# Patient Record
Sex: Female | Born: 1970 | Race: White | Hispanic: No | Smoking: Never smoker
Health system: Southern US, Community
[De-identification: ages and names within clinical notes are randomized; demographics above are authoritative.]

## PROBLEM LIST (undated history)

## (undated) DIAGNOSIS — N920 Excessive and frequent menstruation with regular cycle: Secondary | ICD-10-CM

## (undated) DIAGNOSIS — Z5189 Encounter for other specified aftercare: Secondary | ICD-10-CM

## (undated) DIAGNOSIS — J45909 Unspecified asthma, uncomplicated: Secondary | ICD-10-CM

## (undated) DIAGNOSIS — Z9889 Other specified postprocedural states: Secondary | ICD-10-CM

## (undated) DIAGNOSIS — K219 Gastro-esophageal reflux disease without esophagitis: Secondary | ICD-10-CM

## (undated) DIAGNOSIS — R112 Nausea with vomiting, unspecified: Secondary | ICD-10-CM

## (undated) DIAGNOSIS — D219 Benign neoplasm of connective and other soft tissue, unspecified: Secondary | ICD-10-CM

## (undated) DIAGNOSIS — F419 Anxiety disorder, unspecified: Secondary | ICD-10-CM

## (undated) DIAGNOSIS — F32A Depression, unspecified: Secondary | ICD-10-CM

## (undated) DIAGNOSIS — J189 Pneumonia, unspecified organism: Secondary | ICD-10-CM

## (undated) DIAGNOSIS — D649 Anemia, unspecified: Secondary | ICD-10-CM

## (undated) HISTORY — PX: LAPAROSCOPIC GELPORT ASSISTED MYOMECTOMY: SHX6549

## (undated) HISTORY — PX: VULVECTOMY: SHX1086

## (undated) HISTORY — PX: WISDOM TOOTH EXTRACTION: SHX21

## (undated) HISTORY — PX: TONSILLECTOMY: SUR1361

## (undated) HISTORY — PX: ENDOMETRIAL ABLATION: SHX621

## (undated) HISTORY — PX: APPENDECTOMY: SHX54

## (undated) HISTORY — PX: ABDOMINAL HYSTERECTOMY: SHX81

## (undated) HISTORY — PX: TUBAL LIGATION: SHX77

## (undated) HISTORY — PX: GYNECOLOGIC CRYOSURGERY: SHX857

## (undated) HISTORY — PX: DIAGNOSTIC LAPAROSCOPY: SUR761

## (undated) HISTORY — PX: DILATION AND CURETTAGE OF UTERUS: SHX78

---

## 2012-08-06 ENCOUNTER — Emergency Department: Payer: Self-pay | Admitting: Emergency Medicine

## 2018-07-02 ENCOUNTER — Other Ambulatory Visit: Payer: Self-pay | Admitting: Medical Oncology

## 2018-07-02 DIAGNOSIS — Z1231 Encounter for screening mammogram for malignant neoplasm of breast: Secondary | ICD-10-CM

## 2018-07-04 ENCOUNTER — Ambulatory Visit
Admission: RE | Admit: 2018-07-04 | Discharge: 2018-07-04 | Disposition: A | Discharge status: Home / Self Care | Payer: No Typology Code available for payment source | Source: Ambulatory Visit | Attending: Medical Oncology | Admitting: Medical Oncology

## 2018-07-04 ENCOUNTER — Inpatient Hospital Stay: Admission: RE | Admit: 2018-07-04 | Payer: Self-pay | Source: Ambulatory Visit

## 2018-07-04 ENCOUNTER — Encounter (INDEPENDENT_AMBULATORY_CARE_PROVIDER_SITE_OTHER): Payer: Self-pay

## 2018-07-04 DIAGNOSIS — Z1231 Encounter for screening mammogram for malignant neoplasm of breast: Secondary | ICD-10-CM | POA: Insufficient documentation

## 2019-08-09 ENCOUNTER — Other Ambulatory Visit: Payer: Self-pay | Admitting: Family Medicine

## 2019-08-09 DIAGNOSIS — Z1231 Encounter for screening mammogram for malignant neoplasm of breast: Secondary | ICD-10-CM

## 2019-08-14 ENCOUNTER — Ambulatory Visit
Admission: RE | Admit: 2019-08-14 | Discharge: 2019-08-14 | Disposition: A | Discharge status: Home / Self Care | Payer: No Typology Code available for payment source | Source: Ambulatory Visit | Attending: Family Medicine | Admitting: Family Medicine

## 2019-08-14 ENCOUNTER — Other Ambulatory Visit: Payer: Self-pay

## 2019-08-14 DIAGNOSIS — Z1231 Encounter for screening mammogram for malignant neoplasm of breast: Secondary | ICD-10-CM | POA: Diagnosis present

## 2020-07-02 ENCOUNTER — Other Ambulatory Visit: Payer: Self-pay | Admitting: Obstetrics & Gynecology

## 2020-07-13 NOTE — H&P (Signed)
HPI: Erica Fuller is an established patient with a family history of ovarian and breast cancer. Mother was diagnosed with ovarian cancer at 41 and survived 5.5 years. Stage 3, likely serous. Maternal grandmother had breast cancer in her 62s.   Patient is BRCA 1/2 negative. Has long history of fibroid uterus, menometrorrhagia,  s/p BTL, D&C, laparoscopic myomectomy, cesarean x2, failed endometrial ablation. She is interested in surgical interventions to eliminate her periods, pelvic pressure, and reduce risk of ovarian cancer, as well as the anxiety she harbors daily regarding her cancer risk.  She has been on combined OCPs for >5years, which reduces, but does not eliminate her risk. She has been counseled extensively since our first encounter in March about surgical menopause and effects on cardiovascular, bone, cognitive function, mental health and longevity.  She has understood these risks with clarity and continues to request surgical removal of her ovaries.  She has no contraindications to estrogen therapy following oophorectomy.   She is a Marine scientist; does home care.   Workup:  Pap: 04/2017 neg/neg, cryosurgery 1995, no abnormal paps since.  EMBx: 06/18/20 NO ENDOMETRIAL TISSUE IDENTIFIED.  MINUTE FRAGMENTS OF BENIGN ENDOCERVICAL GLANDS,  MUCUS, AND BLOOD. *No suspicion for malignancy.  TVUS: 11/2019 Uterus: anteverted, enlarged, 16 x 8 x 14cm  With several large intramural (type 3-5) fibroids: 1)Lt lateral=4.5cm 2)posterior=5.9cm 3)Lt lateral mid=4.2cm 4)Lt fundal=4.9cm 5)posterior fundal=4.7cm 6)mid=3.6cm 7)anterior=3.5cm 8)Lt posterior=2.5cm LO: 3.5 x 1 x 1cm RO: 3 x 2 x 2cm  10/2019 CA 125: elevated - 39.6 (normal < 38.1)   Past Medical History:  has a past medical history of Abnormal cytology (01/28/1993), Allergic state, Anxiety, Depression, Encounter for blood transfusion (2007), Family history of ovarian cancer (07/19/2012), Fibroid, Menorrhagia, PONV  (postoperative nausea and vomiting), and Poor intravenous access.  Past Surgical History:  has a past surgical history that includes Appendectomy (1990); Dilation and curettage of uterus (2006); Laparoscopic Myomectomy (04/2004); Tubal ligation (2008); Tonsillectomy (2000); Gynecologic cryosurgery (1995); Cesarean section (2004, 2008); Pelvic laparoscopy (05/2004); Endometrial ablation (10/2009); Tonsillectomy (1997); and vulvectomy (Bilateral, 10/16/2015). Family History: family history includes Alcohol abuse in her paternal grandfather; Breast cancer (age of onset: 34) in her maternal grandmother; Depression in her mother; Diabetes in her maternal grandmother; Glaucoma in her father; Heart disease in her maternal grandmother; Hyperlipidemia (Elevated cholesterol) in her maternal grandmother; Lymphoma in her maternal grandmother; Ovarian cancer (age of onset: 9) in her mother; Prostate cancer in her paternal grandfather. Social History:  reports that she has never smoked. She has never used smokeless tobacco. She reports that she does not drink alcohol and does not use drugs. OB/GYN History:          OB History    Gravida  4   Para  4   Term  2   Preterm  2   AB      Living  3     SAB      IAB      Ectopic      Molar      Multiple  1   Live Births  6          Allergies: is allergic to latex, codeine, fentanyl, morphine, and sulfa (sulfonamide antibiotics). Medications:  Current Outpatient Medications:  .  cyclobenzaprine (FLEXERIL) 10 MG tablet, Take 1 tablet (10 mg total) by mouth nightly as needed, Disp: 30 tablet, Rfl: 11 .  escitalopram oxalate (LEXAPRO) 20 MG tablet, Take 1.5 tablets (30 mg total) by mouth once daily, Disp: 45 tablet,  Rfl: 11 .  gabapentin (NEURONTIN) 100 MG capsule, TAKE 2-3 CAPSULES BY MOUTH NIGHTLY PRN, Disp: 180 capsule, Rfl: 3 .  norethindrone-ethinyl estradiol (JUNEL FE 1/20, 28,) 1 mg-20 mcg (21)/75 mg (7) tablet, Take 1 tablet by mouth  once daily, Disp: 90 tablet, Rfl: 3 .  omeprazole (PRILOSEC) 20 MG DR capsule, Take 20 mg by mouth once daily, Disp: , Rfl:    Review of Systems: No SOB, no palpitations or chest pain, no new lower extremity edema, no nausea or vomiting or bowel or bladder complaints. See HPI for gyn specific ROS.   Exam:   Constitutional: BP 124/68   Ht 177.8 cm (5' 10" )   Wt (!) 103.6 kg (228 lb 6.4 oz)   LMP  (LMP Unknown)   BMI 32.77 kg/m   WDWN female in NAD   HEENT: sclera clear, non-icteric, moist mucous membranes, dentition intact Endocrine:  no thyromegaly Respiratory: normal respiratory effort, CTABL    CV: no peripheral edema, RRR no MRG GI: soft , no mass, non-tender, no rebound tenderness  Genitalia:     Pelvic exam:         External: Tanner stage 5, normal female genitalia for age without lesions or masses, no inguinal lymphadenopathy, normal vulva and perineal sensation, anal wink and bulbocavernosus reflexes present.        Bladder: Normal size without masses or tenderness, well-supported        Urethra: No lesions or discharge with palpation. Normal urethral size and location, no prolapse, no meatal caruncle, no diverticulum        Vagina: fibroid uterus descended within vagina and palpable upon entry. normal physiological discharge, without lesions or masses, no abnormal support structures        Cervix: no lesions or masses, no CMT, no discharge        Adnexa: non-tender, without masses or fullness bilaterally        Uterus: mobile, large but doesn't fill pelvis, well below the umbilicus. right posteriolateral dominant mass, non-tender. 14wk size. Adnexa and Parametria accessible bilaterally.        Anus/Perineum: Normal external exam, no engorged hemorrhoids  Skin: warm and well perfused, no rashes Neuro: alert, oriented x3,   Psych: appropriate mood and insight, judgement intact    Impression:   The primary encounter diagnosis was Preoperative exam for  gynecologic surgery. Diagnoses of Pre-op examination, Menorrhalgia, Intramural leiomyoma of uterus, Pre-procedural examination, and Pelvic pressure in female were also pertinent to this visit.    Plan:     1. Her enlarged uterus is mobile and able to shift within the pelvis, and I was able to feel the adnexa and parametria bilaterally.  I am cautious but confident that the laparoscopic approach can be attempted.  Patient understands that if this is found to be unsafe during surgery, that an open incision will be performed.     2. We have now twice discussed in detail the alternatives to hysterectomy, as well as risks and benefits of her desired surgery.  She continues to request hysterectomy + BSO.  Planned procedure: Total laparoscopic hysterectomy, with bilateral salpingo-oophorectomy with hand-morcellation. Will decide intraoperatively if TLH vs LSH is feasible.  She has hx of cryosurgery >65yr ago with routine paps and no abnormal paps since.  The patient and I discussed the technical aspects of the procedure including various routes, and the potential of each for risks and complications.These include but are not limited to the risk of infection requiring post-operative antibiotics or  further procedures.We talked about the risk of injury to adjacent organs including bladder, bowel, ureter, blood vessels or nerves, the need to convert to an open incision, possibleneed for blood transfusion andpostop complications such asthromboembolic or cardiopulmonary complications.All of her questions were answered. Her preoperative exam was completed. She is scheduled to undergo this procedure in the near future.  I personally performed the service. (TP)  Terena Bohan CRIST Isla Sabree, MD

## 2020-07-17 ENCOUNTER — Encounter
Admission: RE | Admit: 2020-07-17 | Discharge: 2020-07-17 | Disposition: A | Discharge status: Home / Self Care | Payer: No Typology Code available for payment source | Source: Ambulatory Visit | Attending: Obstetrics & Gynecology | Admitting: Obstetrics & Gynecology

## 2020-07-17 DIAGNOSIS — Z01812 Encounter for preprocedural laboratory examination: Secondary | ICD-10-CM | POA: Insufficient documentation

## 2020-07-17 HISTORY — DX: Encounter for other specified aftercare: Z51.89

## 2020-07-17 HISTORY — DX: Unspecified asthma, uncomplicated: J45.909

## 2020-07-17 HISTORY — DX: Anxiety disorder, unspecified: F41.9

## 2020-07-17 HISTORY — DX: Benign neoplasm of connective and other soft tissue, unspecified: D21.9

## 2020-07-17 HISTORY — DX: Other specified postprocedural states: Z98.890

## 2020-07-17 HISTORY — DX: Anemia, unspecified: D64.9

## 2020-07-17 HISTORY — DX: Nausea with vomiting, unspecified: R11.2

## 2020-07-17 HISTORY — DX: Excessive and frequent menstruation with regular cycle: N92.0

## 2020-07-17 HISTORY — DX: Depression, unspecified: F32.A

## 2020-07-17 HISTORY — DX: Pneumonia, unspecified organism: J18.9

## 2020-07-17 HISTORY — DX: Gastro-esophageal reflux disease without esophagitis: K21.9

## 2020-07-17 NOTE — Patient Instructions (Addendum)
Your procedure is scheduled on: 07/27/20-  Monday Report to the Registration Desk on the 1st floor of the [facility]. To find out your arrival time, please call [phone] between 1PM - 3PM on: 07/24/20- Friday  REMEMBER: Instructions that are not followed completely may result in serious medical risk, up to and including death; or upon the discretion of your surgeon and anesthesiologist your surgery may need to be rescheduled.  Do not eat food after midnight the night before surgery.  No gum chewing, lozengers or hard candies.  You may however, drink CLEAR liquids up to 2 hours before you are scheduled to arrive for your surgery. Do not drink anything within 2 hours of your scheduled arrival time.  Clear liquids include: - water  - apple juice without pulp - gatorade (not RED, PURPLE, OR BLUE) - black coffee or tea (Do NOT add milk or creamers to the coffee or tea) Do NOT drink anything that is not on this list.  In addition, your doctor has ordered for you to drink the provided  Ensure Pre-Surgery Clear Carbohydrate Drink  Drinking this carbohydrate drink up to two hours before surgery helps to reduce insulin resistance and improve patient outcomes. Please complete drinking 2 hours prior to scheduled arrival time.  TAKE THESE MEDICATIONS THE MORNING OF SURGERY WITH A SIP OF WATER:  - omeprazole (PRILOSEC) 20 MG capsule, take one the night before and one on the morning of surgery - helps to prevent nausea after surgery.  One week prior to surgery: Stop Anti-inflammatories (NSAIDS) such as Advil, Aleve, Ibuprofen, Motrin, Naproxen, Naprosyn and Aspirin based products such as Excedrin, Goodys Powder, BC Powder.  Stop ANY OVER THE COUNTER supplements until after surgery. (However, you may continue taking Vitamin D, Vitamin B, and multivitamin up until the day before surgery.)  No Alcohol for 24 hours before or after surgery.  No Smoking including e-cigarettes for 24 hours prior  to surgery.  No chewable tobacco products for at least 6 hours prior to surgery.  No nicotine patches on the day of surgery.  Do not use any "recreational" drugs for at least a week prior to your surgery.  Please be advised that the combination of cocaine and anesthesia may have negative outcomes, up to and including death. If you test positive for cocaine, your surgery will be cancelled.  On the morning of surgery brush your teeth with toothpaste and water, you may rinse your mouth with mouthwash if you wish. Do not swallow any toothpaste or mouthwash.  Do not wear jewelry, make-up, hairpins, clips or nail polish.  Do not wear lotions, powders, or perfumes.   Do not shave body from the neck down 48 hours prior to surgery just in case you cut yourself which could leave a site for infection.  Also, freshly shaved skin may become irritated if using the CHG soap.  Contact lenses, hearing aids and dentures may not be worn into surgery.  Do not bring valuables to the hospital. [facility] is not responsible for any missing/lost belongings or valuables.   Use CHG Soap or wipes as directed on instruction sheet.  Notify your doctor if there is any change in your medical condition (cold, fever, infection).  Wear comfortable clothing (specific to your surgery type) to the hospital.  Plan for stool softeners for home use; pain medications have a tendency to cause constipation. You can also help prevent constipation by eating foods high in fiber such as fruits and vegetables and drinking  plenty of fluids as your diet allows.  After surgery, you can help prevent lung complications by doing breathing exercises.  Take deep breaths and cough every 1-2 hours. Your doctor may order a device called an Incentive Spirometer to help you take deep breaths. When coughing or sneezing, hold a pillow firmly against your incision with both hands. This is called "splinting." Doing this helps protect your  incision. It also decreases belly discomfort.  If you are being admitted to the hospital overnight, leave your suitcase in the car. After surgery it may be brought to your room.  If you are being discharged the day of surgery, you will not be allowed to drive home. You will need a responsible adult (18 years or older) to drive you home and stay with you that night.   If you are taking public transportation, you will need to have a responsible adult (18 years or older) with you. Please confirm with your physician that it is acceptable to use public transportation.   Please call the [place] Dept. at [phone] if you have any questions about these instructions.  Visitation Policy:  Patients undergoing a surgery or procedure may have one family member or support person with them as long as that person is not COVID-19 positive or experiencing its symptoms.  That person may remain in the waiting area during the procedure.  Inpatient Visitation Update:   In an effort to ensure the safety of our team members and our patients, we are implementing a change to our visitation policy:  Effective Monday, Aug. 9, at 7 a.m., inpatients will be allowed one support person.  o The support person may change daily.  o The support person must pass our screening, gel in and out, and wear a mask at all times, including in the patient's room.  o Patients must also wear a mask when staff or their support person are in the room.  o Masking is required regardless of vaccination status.  Systemwide, no visitors 17 or younger.

## 2020-07-24 ENCOUNTER — Other Ambulatory Visit
Admission: RE | Admit: 2020-07-24 | Discharge: 2020-07-24 | Disposition: A | Discharge status: Home / Self Care | Payer: No Typology Code available for payment source | Source: Ambulatory Visit | Attending: Obstetrics & Gynecology | Admitting: Obstetrics & Gynecology

## 2020-07-24 ENCOUNTER — Other Ambulatory Visit: Payer: Self-pay

## 2020-07-24 DIAGNOSIS — Z20822 Contact with and (suspected) exposure to covid-19: Secondary | ICD-10-CM | POA: Diagnosis not present

## 2020-07-24 DIAGNOSIS — Z01812 Encounter for preprocedural laboratory examination: Secondary | ICD-10-CM | POA: Diagnosis not present

## 2020-07-24 LAB — BASIC METABOLIC PANEL
Anion gap: 9 (ref 5–15)
BUN: 22 mg/dL — ABNORMAL HIGH (ref 6–20)
CO2: 24 mmol/L (ref 22–32)
Calcium: 9.1 mg/dL (ref 8.9–10.3)
Chloride: 104 mmol/L (ref 98–111)
Creatinine, Ser: 0.87 mg/dL (ref 0.44–1.00)
GFR, Estimated: >60 mL/min (ref >60)
Glucose, Bld: 117 mg/dL — ABNORMAL HIGH (ref 70–99)
Potassium: 3.7 mmol/L (ref 3.5–5.1)
Sodium: 137 mmol/L (ref 135–145)

## 2020-07-24 LAB — CBC
HCT: 43.5 % (ref 36.0–46.0)
Hemoglobin: 14.7 g/dL (ref 12.0–15.0)
MCH: 29.1 pg (ref 26.0–34.0)
MCHC: 33.8 g/dL (ref 30.0–36.0)
MCV: 86.1 fL (ref 80.0–100.0)
Platelets: 358 10*3/uL (ref 150–400)
RBC: 5.05 MIL/uL (ref 3.87–5.11)
RDW: 13.2 % (ref 11.5–15.5)
WBC: 9.7 10*3/uL (ref 4.0–10.5)
nRBC: 0 % (ref 0.0–0.2)

## 2020-07-24 LAB — TYPE AND SCREEN
ABO/RH(D): A POS
Antibody Screen: NEGATIVE

## 2020-07-24 LAB — SARS CORONAVIRUS 2 (TAT 6-24 HRS): SARS Coronavirus 2: NEGATIVE

## 2020-07-27 ENCOUNTER — Encounter: Payer: Self-pay | Admitting: Obstetrics & Gynecology

## 2020-07-27 ENCOUNTER — Encounter
Admission: RE | Disposition: A | Discharge status: Home / Self Care | Payer: Self-pay | Source: Home / Self Care | Attending: Obstetrics & Gynecology

## 2020-07-27 ENCOUNTER — Other Ambulatory Visit: Payer: Self-pay

## 2020-07-27 ENCOUNTER — Ambulatory Visit: Payer: No Typology Code available for payment source | Admitting: Anesthesiology

## 2020-07-27 ENCOUNTER — Ambulatory Visit
Admission: RE | Admit: 2020-07-27 | Discharge: 2020-07-27 | Disposition: A | Discharge status: Home / Self Care | Payer: No Typology Code available for payment source | Attending: Obstetrics & Gynecology | Admitting: Obstetrics & Gynecology

## 2020-07-27 DIAGNOSIS — Z882 Allergy status to sulfonamides status: Secondary | ICD-10-CM | POA: Diagnosis not present

## 2020-07-27 DIAGNOSIS — Z91040 Latex allergy status: Secondary | ICD-10-CM | POA: Insufficient documentation

## 2020-07-27 DIAGNOSIS — D251 Intramural leiomyoma of uterus: Secondary | ICD-10-CM | POA: Diagnosis not present

## 2020-07-27 DIAGNOSIS — Z885 Allergy status to narcotic agent status: Secondary | ICD-10-CM | POA: Diagnosis not present

## 2020-07-27 DIAGNOSIS — Z803 Family history of malignant neoplasm of breast: Secondary | ICD-10-CM | POA: Insufficient documentation

## 2020-07-27 DIAGNOSIS — Z8041 Family history of malignant neoplasm of ovary: Secondary | ICD-10-CM | POA: Diagnosis not present

## 2020-07-27 DIAGNOSIS — Z79899 Other long term (current) drug therapy: Secondary | ICD-10-CM | POA: Diagnosis not present

## 2020-07-27 HISTORY — PX: LAPAROSCOPIC BILATERAL SALPINGO OOPHERECTOMY: SHX5890

## 2020-07-27 HISTORY — PX: LAPAROSCOPIC SUPRACERVICAL HYSTERECTOMY: SHX5399

## 2020-07-27 LAB — POCT PREGNANCY, URINE: Preg Test, Ur: NEGATIVE

## 2020-07-27 LAB — ABO/RH: ABO/RH(D): A POS

## 2020-07-27 SURGERY — HYSTERECTOMY, SUPRACERVICAL, LAPAROSCOPIC
Anesthesia: General

## 2020-07-27 MED ORDER — CHLORHEXIDINE GLUCONATE 0.12 % MT SOLN: 15.0000 mL | Freq: Once | OROMUCOSAL | Status: AC

## 2020-07-27 MED ORDER — HEPARIN SODIUM (PORCINE) 5000 UNIT/ML IJ SOLN
INTRAMUSCULAR | Status: AC
  Administered 2020-07-27: 5000 [IU] via SUBCUTANEOUS
  Filled 2020-07-27: qty 1

## 2020-07-27 MED ORDER — GABAPENTIN 300 MG PO CAPS
ORAL_CAPSULE | ORAL | Status: AC
  Administered 2020-07-27: 600 mg via ORAL
  Filled 2020-07-27: qty 2

## 2020-07-27 MED ORDER — CEFAZOLIN SODIUM-DEXTROSE 2-4 GM/100ML-% IV SOLN
INTRAVENOUS | Status: AC
  Filled 2020-07-27: qty 100

## 2020-07-27 MED ORDER — DEXAMETHASONE SODIUM PHOSPHATE 10 MG/ML IJ SOLN: 4.0000 mg | INTRAMUSCULAR | Status: AC

## 2020-07-27 MED ORDER — ROCURONIUM BROMIDE 100 MG/10ML IV SOLN
INTRAVENOUS | Status: DC | PRN
  Administered 2020-07-27 (×2): 20 mg via INTRAVENOUS
  Administered 2020-07-27: 50 mg via INTRAVENOUS

## 2020-07-27 MED ORDER — MIDAZOLAM HCL 2 MG/2ML IJ SOLN
INTRAMUSCULAR | Status: AC
  Filled 2020-07-27: qty 2

## 2020-07-27 MED ORDER — HYDROMORPHONE HCL 1 MG/ML IJ SOLN
INTRAMUSCULAR | Status: DC | PRN
Start: 2020-07-27 — End: 2020-07-27
  Administered 2020-07-27 (×2): .5 mg via INTRAVENOUS

## 2020-07-27 MED ORDER — GLYCOPYRROLATE 0.2 MG/ML IJ SOLN
INTRAMUSCULAR | Status: DC | PRN
  Administered 2020-07-27: .2 mg via INTRAVENOUS

## 2020-07-27 MED ORDER — HYDROMORPHONE HCL 1 MG/ML IJ SOLN
INTRAMUSCULAR | Status: AC
  Filled 2020-07-27: qty 1

## 2020-07-27 MED ORDER — ORAL CARE MOUTH RINSE: 15.0000 mL | Freq: Once | OROMUCOSAL | Status: AC

## 2020-07-27 MED ORDER — ONDANSETRON HCL 4 MG/2ML IJ SOLN
INTRAMUSCULAR | Status: DC | PRN
  Administered 2020-07-27 (×2): 4 mg via INTRAVENOUS

## 2020-07-27 MED ORDER — IBUPROFEN 800 MG PO TABS: 800.0000 mg | ORAL_TABLET | Freq: Four times a day (QID) | ORAL | 0 refills | Status: DC

## 2020-07-27 MED ORDER — ESTRADIOL 0.025 MG/24HR TD PTWK: 0.0250 mg | MEDICATED_PATCH | TRANSDERMAL | 11 refills | Status: AC

## 2020-07-27 MED ORDER — OXYCODONE HCL 5 MG PO TABS
ORAL_TABLET | ORAL | Status: AC
  Administered 2020-07-27: 5 mg via ORAL
  Filled 2020-07-27: qty 1

## 2020-07-27 MED ORDER — DEXMEDETOMIDINE (PRECEDEX) IN NS 20 MCG/5ML (4 MCG/ML) IV SYRINGE
PREFILLED_SYRINGE | INTRAVENOUS | Status: DC | PRN
  Administered 2020-07-27 (×2): 8 ug via INTRAVENOUS

## 2020-07-27 MED ORDER — GABAPENTIN 300 MG PO CAPS: 600.0000 mg | ORAL_CAPSULE | ORAL | Status: AC

## 2020-07-27 MED ORDER — KETOROLAC TROMETHAMINE 15 MG/ML IJ SOLN: 15.0000 mg | INTRAMUSCULAR | Status: AC

## 2020-07-27 MED ORDER — OXYCODONE HCL 5 MG PO TABS: 5.0000 mg | ORAL_TABLET | Freq: Once | ORAL | Status: AC | PRN

## 2020-07-27 MED ORDER — PHENYLEPHRINE HCL (PRESSORS) 10 MG/ML IV SOLN
INTRAVENOUS | Status: DC | PRN
  Administered 2020-07-27: 100 ug via INTRAVENOUS

## 2020-07-27 MED ORDER — METHYLENE BLUE 0.5 % INJ SOLN
INTRAVENOUS | Status: AC
  Filled 2020-07-27: qty 10

## 2020-07-27 MED ORDER — OXYCODONE HCL 5 MG PO TABS
5.0000 mg | ORAL_TABLET | ORAL | 0 refills | Status: DC | PRN
Start: 2020-07-27 — End: 2021-07-08

## 2020-07-27 MED ORDER — HEPARIN SODIUM (PORCINE) 5000 UNIT/ML IJ SOLN: 5000.0000 [IU] | INTRAMUSCULAR | Status: AC

## 2020-07-27 MED ORDER — CHLORHEXIDINE GLUCONATE 0.12 % MT SOLN
OROMUCOSAL | Status: AC
  Administered 2020-07-27: 15 mL via OROMUCOSAL
  Filled 2020-07-27: qty 15

## 2020-07-27 MED ORDER — LIDOCAINE HCL (PF) 2 % IJ SOLN
INTRAMUSCULAR | Status: AC
  Filled 2020-07-27: qty 5

## 2020-07-27 MED ORDER — BUPIVACAINE LIPOSOME 1.3 % IJ SUSP
INTRAMUSCULAR | Status: DC | PRN
  Administered 2020-07-27: 20 mL

## 2020-07-27 MED ORDER — ENSURE PRE-SURGERY PO LIQD
296.0000 mL | Freq: Once | ORAL | Status: DC
  Filled 2020-07-27: qty 296

## 2020-07-27 MED ORDER — SUCCINYLCHOLINE CHLORIDE 200 MG/10ML IV SOSY
PREFILLED_SYRINGE | INTRAVENOUS | Status: AC
  Filled 2020-07-27: qty 10

## 2020-07-27 MED ORDER — PROMETHAZINE HCL 25 MG/ML IJ SOLN: 6.2500 mg | INTRAMUSCULAR | Status: DC | PRN

## 2020-07-27 MED ORDER — MIDAZOLAM HCL 2 MG/2ML IJ SOLN
INTRAMUSCULAR | Status: DC | PRN
  Administered 2020-07-27: 2 mg via INTRAVENOUS

## 2020-07-27 MED ORDER — ACETAMINOPHEN 500 MG PO TABS
1000.0000 mg | ORAL_TABLET | ORAL | Status: AC
  Administered 2020-07-27: 1000 mg via ORAL

## 2020-07-27 MED ORDER — FENTANYL CITRATE (PF) 100 MCG/2ML IJ SOLN
INTRAMUSCULAR | Status: AC
  Filled 2020-07-27: qty 2

## 2020-07-27 MED ORDER — ACETAMINOPHEN 500 MG PO TABS: 1000.0000 mg | ORAL_TABLET | Freq: Four times a day (QID) | ORAL | 2 refills | Status: AC

## 2020-07-27 MED ORDER — SUGAMMADEX SODIUM 500 MG/5ML IV SOLN
INTRAVENOUS | Status: AC
  Filled 2020-07-27: qty 5

## 2020-07-27 MED ORDER — SUGAMMADEX SODIUM 500 MG/5ML IV SOLN
INTRAVENOUS | Status: DC | PRN
  Administered 2020-07-27: 215 mg via INTRAVENOUS

## 2020-07-27 MED ORDER — PROPOFOL 10 MG/ML IV BOLUS
INTRAVENOUS | Status: DC | PRN
  Administered 2020-07-27: 170 mg via INTRAVENOUS

## 2020-07-27 MED ORDER — BUPIVACAINE LIPOSOME 1.3 % IJ SUSP
INTRAMUSCULAR | Status: AC
  Filled 2020-07-27: qty 20

## 2020-07-27 MED ORDER — SCOPOLAMINE 1 MG/3DAYS TD PT72: 1.0000 | MEDICATED_PATCH | TRANSDERMAL | Status: DC

## 2020-07-27 MED ORDER — OXYCODONE HCL 5 MG/5ML PO SOLN: 5.0000 mg | Freq: Once | ORAL | Status: AC | PRN

## 2020-07-27 MED ORDER — ACETAMINOPHEN 500 MG PO TABS
ORAL_TABLET | ORAL | Status: AC
  Filled 2020-07-27: qty 2

## 2020-07-27 MED ORDER — PROPOFOL 10 MG/ML IV BOLUS
INTRAVENOUS | Status: AC
  Filled 2020-07-27: qty 20

## 2020-07-27 MED ORDER — SCOPOLAMINE 1 MG/3DAYS TD PT72
MEDICATED_PATCH | TRANSDERMAL | Status: AC
  Administered 2020-07-27: 1.5 mg via TRANSDERMAL
  Filled 2020-07-27: qty 1

## 2020-07-27 MED ORDER — DEXAMETHASONE SODIUM PHOSPHATE 10 MG/ML IJ SOLN
INTRAMUSCULAR | Status: AC
  Administered 2020-07-27: 4 mg via INTRAVENOUS
  Filled 2020-07-27: qty 1

## 2020-07-27 MED ORDER — CEFAZOLIN SODIUM-DEXTROSE 2-4 GM/100ML-% IV SOLN
2.0000 g | INTRAVENOUS | Status: AC
  Administered 2020-07-27: 2 g via INTRAVENOUS

## 2020-07-27 MED ORDER — ROCURONIUM BROMIDE 10 MG/ML (PF) SYRINGE
PREFILLED_SYRINGE | INTRAVENOUS | Status: AC
  Filled 2020-07-27: qty 10

## 2020-07-27 MED ORDER — LACTATED RINGERS IV SOLN: INTRAVENOUS | Status: DC

## 2020-07-27 MED ORDER — KETOROLAC TROMETHAMINE 15 MG/ML IJ SOLN
INTRAMUSCULAR | Status: AC
  Administered 2020-07-27: 15 mg via INTRAVENOUS
  Filled 2020-07-27: qty 1

## 2020-07-27 MED ORDER — LIDOCAINE HCL (CARDIAC) PF 100 MG/5ML IV SOSY
PREFILLED_SYRINGE | INTRAVENOUS | Status: DC | PRN
  Administered 2020-07-27: 100 mg via INTRAVENOUS

## 2020-07-27 MED ORDER — ONDANSETRON HCL 4 MG/2ML IJ SOLN
INTRAMUSCULAR | Status: AC
  Filled 2020-07-27: qty 2

## 2020-07-27 MED ORDER — DEXAMETHASONE SODIUM PHOSPHATE 10 MG/ML IJ SOLN
INTRAMUSCULAR | Status: DC | PRN
  Administered 2020-07-27: 10 mg via INTRAVENOUS

## 2020-07-27 MED ORDER — POVIDONE-IODINE 10 % EX SWAB: 2.0000 "application " | Freq: Once | CUTANEOUS | Status: DC

## 2020-07-27 SURGICAL SUPPLY — 70 items
ADH SKN CLS APL DERMABOND .7 (GAUZE/BANDAGES/DRESSINGS) ×2
APL PRP STRL LF DISP 70% ISPRP (MISCELLANEOUS) ×2
APL SRG 38 LTWT LNG FL B (MISCELLANEOUS)
APPLICATOR ARISTA FLEXITIP XL (MISCELLANEOUS) IMPLANT
BAG DRN RND TRDRP ANRFLXCHMBR (UROLOGICAL SUPPLIES) ×2
BAG SPEC RTRVL LRG 6X4 10 (ENDOMECHANICALS)
BAG URINE DRAIN 2000ML AR STRL (UROLOGICAL SUPPLIES) ×3 IMPLANT
BASIN GRAD PLASTIC 32OZ STRL (MISCELLANEOUS) ×3 IMPLANT
BLADE SURG SZ11 CARB STEEL (BLADE) ×3 IMPLANT
CATH FOLEY 2WAY  5CC 16FR (CATHETERS) ×1
CATH FOLEY 2WAY 5CC 16FR (CATHETERS) ×2
CATH URTH 16FR FL 2W BLN LF (CATHETERS) ×2 IMPLANT
CHLORAPREP W/TINT 26 (MISCELLANEOUS) ×3 IMPLANT
COVER WAND RF STERILE (DRAPES) ×3 IMPLANT
DEFOGGER SCOPE WARMER CLEARIFY (MISCELLANEOUS) ×3 IMPLANT
DERMABOND ADVANCED (GAUZE/BANDAGES/DRESSINGS) ×1
DERMABOND ADVANCED .7 DNX12 (GAUZE/BANDAGES/DRESSINGS) ×2 IMPLANT
DRAPE 3/4 80X56 (DRAPES) ×3 IMPLANT
DRAPE LEGGINS SURG 28X43 STRL (DRAPES) ×3 IMPLANT
DRAPE UNDER BUTTOCK W/FLU (DRAPES) ×3 IMPLANT
ELECT REM PT RETURN 9FT ADLT (ELECTROSURGICAL) ×3
ELECTRODE REM PT RTRN 9FT ADLT (ELECTROSURGICAL) ×2 IMPLANT
EXTRT SYSTEM ALEXIS 17CM (MISCELLANEOUS) ×3
GLOVE PI ORTHOPRO 6.5 (GLOVE) ×5
GLOVE PI ORTHOPRO STRL 6.5 (GLOVE) ×10 IMPLANT
GLOVE SURG SYN 6.5 ES PF (GLOVE) ×15 IMPLANT
GOWN STRL REUS W/ TWL LRG LVL3 (GOWN DISPOSABLE) ×8 IMPLANT
GOWN STRL REUS W/ TWL XL LVL3 (GOWN DISPOSABLE) ×2 IMPLANT
GOWN STRL REUS W/TWL LRG LVL3 (GOWN DISPOSABLE) ×12
GOWN STRL REUS W/TWL XL LVL3 (GOWN DISPOSABLE) ×3
GRASPER SUT TROCAR 14GX15 (MISCELLANEOUS) ×3 IMPLANT
HEMOSTAT ARISTA ABSORB 3G PWDR (HEMOSTASIS) IMPLANT
IRRIGATION STRYKERFLOW (MISCELLANEOUS) IMPLANT
IRRIGATOR STRYKERFLOW (MISCELLANEOUS)
IV LACTATED RINGERS 1000ML (IV SOLUTION) IMPLANT
KIT PINK PAD W/HEAD ARE REST (MISCELLANEOUS) ×3
KIT PINK PAD W/HEAD ARM REST (MISCELLANEOUS) ×2 IMPLANT
KIT TURNOVER CYSTO (KITS) ×3 IMPLANT
L-HOOK LAP DISP 36CM (ELECTROSURGICAL) ×3
LABEL OR SOLS (LABEL) ×3 IMPLANT
LHOOK LAP DISP 36CM (ELECTROSURGICAL) ×2 IMPLANT
LIGASURE VESSEL 5MM BLUNT TIP (ELECTROSURGICAL) ×3 IMPLANT
MANIFOLD NEPTUNE II (INSTRUMENTS) ×3 IMPLANT
MANIPULATOR UTERINE 4.5 ZUMI (MISCELLANEOUS) IMPLANT
MORCELLATOR XCISE  COR (MISCELLANEOUS)
MORCELLATOR XCISE COR (MISCELLANEOUS) IMPLANT
NS IRRIG 500ML POUR BTL (IV SOLUTION) ×3 IMPLANT
PACK LAP CHOLECYSTECTOMY (MISCELLANEOUS) ×3 IMPLANT
PAD OB MATERNITY 4.3X12.25 (PERSONAL CARE ITEMS) ×3 IMPLANT
PAD PREP 24X41 OB/GYN DISP (PERSONAL CARE ITEMS) ×3 IMPLANT
PENCIL ELECTRO HAND CTR (MISCELLANEOUS) ×3 IMPLANT
POUCH SPECIMEN RETRIEVAL 10MM (ENDOMECHANICALS) IMPLANT
RETRACTOR WOUND ALXS 18CM SML (MISCELLANEOUS) ×2 IMPLANT
RTRCTR WOUND ALEXIS O 18CM SML (MISCELLANEOUS) ×3
SET TUBE SMOKE EVAC HIGH FLOW (TUBING) ×3 IMPLANT
SLEEVE ENDOPATH XCEL 5M (ENDOMECHANICALS) ×6 IMPLANT
SUT MNCRL 4-0 (SUTURE) ×6
SUT MNCRL 4-0 27XMFL (SUTURE) ×4
SUT MNCRL AB 4-0 PS2 18 (SUTURE) ×3 IMPLANT
SUT VIC AB 0 CT1 36 (SUTURE) ×3 IMPLANT
SUT VIC AB 3-0 SH 27 (SUTURE) ×3
SUT VIC AB 3-0 SH 27X BRD (SUTURE) ×2 IMPLANT
SUT VICRYL 0 AB UR-6 (SUTURE) ×6 IMPLANT
SUTURE MNCRL 4-0 27XMF (SUTURE) ×4 IMPLANT
SYR 50ML LL SCALE MARK (SYRINGE) IMPLANT
SYSTEM CONTND EXTRCTN KII BLLN (MISCELLANEOUS) ×2 IMPLANT
TROCAR ENDO BLADELESS 11MM (ENDOMECHANICALS) ×3 IMPLANT
TROCAR XCEL NON-BLD 5MMX100MML (ENDOMECHANICALS) ×3 IMPLANT
TUBING ART PRESS 48 MALE/FEM (TUBING) IMPLANT
TUBING EVAC SMOKE HEATED PNEUM (TUBING) ×3 IMPLANT

## 2020-07-27 NOTE — Discharge Instructions (Addendum)
Discharge instructions:  Call office if you have any of the following: fever >101 F, chills, shortness of breath, excessive vaginal bleeding, incision drainage or problems, leg pain or redness, or any other concerns.   Activity: Do not lift > 20 lbs for 8 weeks.  No intercourse or tampons for 8 weeks.  No driving until you are certain you can slam on the brakes, and of course never while taking narcotics.   You may feel some pain in your upper right abdomen/rib and right shoulder.  This is from the gas in the abdomen for surgery. This will subside over time, please be patient!  Take 800mg  Ibuprofen and 1000mg  Tylenol together, around the clock, every 6 hours for at least the first 3-5 days.  After this you can take as needed.  This will help decrease inflammation and promote healing.  The narcotics you'll take just as needed, as they just trick your brain into thinking its not in pain.  Wear your abdominal binder across your incisions as often as you can, every day for at least the first week.    Please don't limit yourself in terms of routine activity.  You will be able to do most things, although they may take longer to do or be a little painful.  You can do it!  Don't be a hero, but don't be a wimp either!   Bupivacaine Liposomal Suspension for Injection What is this medicine? BUPIVACAINE LIPOSOMAL (bue PIV a kane LIP oh som al) is an anesthetic. It causes loss of feeling in the skin or other tissues. It is used to prevent and to treat pain from some procedures. This medicine may be used for other purposes; ask your health care provider or pharmacist if you have questions. COMMON BRAND NAME(S): EXPAREL What should I tell my health care provider before I take this medicine? They need to know if you have any of these conditions:  G6PD deficiency  heart disease  kidney disease  liver disease  low blood pressure  lung or breathing disease, like asthma  an unusual or allergic  reaction to bupivacaine, other medicines, foods, dyes, or preservatives  pregnant or trying to get pregnant  breast-feeding How should I use this medicine? This medicine is for injection into the affected area. It is given by a health care professional in a hospital or clinic setting. Talk to your pediatrician regarding the use of this medicine in children. Special care may be needed. Overdosage: If you think you have taken too much of this medicine contact a poison control center or emergency room at once. NOTE: This medicine is only for you. Do not share this medicine with others. What if I miss a dose? This does not apply. What may interact with this medicine? This medicine may interact with the following medications:  acetaminophen  certain antibiotics like dapsone, nitrofurantoin, aminosalicylic acid, sulfonamides  certain medicines for seizures like phenobarbital, phenytoin, valproic acid  chloroquine  cyclophosphamide  flutamide  hydroxyurea  ifosfamide  metoclopramide  nitric oxide  nitroglycerin  nitroprusside  nitrous oxide  other local anesthetics like lidocaine, pramoxine, tetracaine  primaquine  quinine  rasburicase  sulfasalazine This list may not describe all possible interactions. Give your health care provider a list of all the medicines, herbs, non-prescription drugs, or dietary supplements you use. Also tell them if you smoke, drink alcohol, or use illegal drugs. Some items may interact with your medicine. What should I watch for while using this medicine? Your condition will  be monitored carefully while you are receiving this medicine. Be careful to avoid injury while the area is numb, and you are not aware of pain. What side effects may I notice from receiving this medicine? Side effects that you should report to your doctor or health care professional as soon as possible:  allergic reactions like skin rash, itching or hives, swelling of  the face, lips, or tongue  seizures  signs and symptoms of a dangerous change in heartbeat or heart rhythm like chest pain; dizziness; fast, irregular heartbeat; palpitations; feeling faint or lightheaded; falls; breathing problems  signs and symptoms of methemoglobinemia such as pale, gray, or blue colored skin; headache; fast heartbeat; shortness of breath; feeling faint or lightheaded, falls; tiredness Side effects that usually do not require medical attention (report to your doctor or health care professional if they continue or are bothersome):  anxious  back pain  changes in taste  changes in vision  constipation  dizziness  fever  nausea, vomiting This list may not describe all possible side effects. Call your doctor for medical advice about side effects. You may report side effects to FDA at 1-800-FDA-1088. Where should I keep my medicine? This drug is given in a hospital or clinic and will not be stored at home. NOTE: This sheet is a summary. It may not cover all possible information. If you have questions about this medicine, talk to your doctor, pharmacist, or health care provider.  2020 Elsevier/Gold Standard (2019-05-28 10:48:23)       AMBULATORY SURGERY  DISCHARGE INSTRUCTIONS   1) The drugs that you were given will stay in your system until tomorrow so for the next 24 hours you should not:  A) Drive an automobile B) Make any legal decisions C) Drink any alcoholic beverage   2) You may resume regular meals tomorrow.  Today it is better to start with liquids and gradually work up to solid foods.  You may eat anything you prefer, but it is better to start with liquids, then soup and crackers, and gradually work up to solid foods.   3) Please notify your doctor immediately if you have any unusual bleeding, trouble breathing, redness and pain at the surgery site, drainage, fever, or pain not relieved by medication.    4) Additional  Instructions:    Please contact your physician with any problems or Same Day Surgery at [phone], Monday through Friday 6 am to 4 pm, or [place] at [facility] number at [phone].

## 2020-07-27 NOTE — Interval H&P Note (Signed)
History and Physical Interval Note:  07/27/2020 1:00 PM  Erica Fuller  has presented today for surgery, with the diagnosis of family history or ovarian malignancy, fibroid uterus.  The various methods of treatment have been discussed with the patient and family. After consideration of risks, benefits and other options for treatment, the patient has consented to  Procedure(s): LAPAROSCOPIC SUPRACERVICAL HYSTERECTOMY (N/A) LAPAROSCOPIC BILATERAL SALPINGO OOPHORECTOMY (Bilateral) as a surgical intervention.  The patient's history has been reviewed, patient examined, no change in status, stable for surgery.  I have reviewed the patient's chart and labs.  Questions were answered to the patient's satisfaction.     [place]

## 2020-07-27 NOTE — Op Note (Addendum)
Total Laparoscopic Hysterectomy Operative Note Procedure Date: 07/27/2020  Patient:  Erica Fuller  49 y.o. female  PRE-OPERATIVE DIAGNOSIS:  family history or ovarian malignancy, fibroid uterus  POST-OPERATIVE DIAGNOSIS:  family history or ovarian malignancy, fibroid uterus  PROCEDURE:  Procedure(s): LAPAROSCOPIC SUPRACERVICAL HYSTERECTOMY (N/A) LAPAROSCOPIC BILATERAL SALPINGO OOPHORECTOMY (Bilateral)  SURGEON:  Surgeon(s) and Role:    * Chania Kochanski, Honor Loh, MD - Primary    * Benjaman Kindler, MD - Assisting  ANESTHESIA:  General via ET  I/O  Total I/O In: -  Out: 1310 [Urine:1200; Blood:110]  See flowsheet  FINDINGS:  Massive fibroid uterus (~900g), with peritoneal attachments laterally.  normal ovaries and fallopian tubes bilaterally.  Normal upper abdomen.  SPECIMEN: Uterus, and bilateral fallopian tubes and ovaries (morcellated)  COMPLICATIONS: none apparent  DISPOSITION: vital signs stable to PACU  Indication for Surgery: 49 y.o.  With enlarged fibroid uterus and menorrhagia who requests definitive management.  Risks of surgery were discussed with the patient including but not limited to: bleeding which may require transfusion or reoperation; infection which may require antibiotics; injury to bowel, bladder, ureters or other surrounding organs; need for additional procedures including laparotomy, blood clot, incisional problems and other postoperative/anesthesia complications. Written informed consent was obtained.      PROCEDURE IN DETAIL:  The patient had 5000u Heparin Sub-q and sequential compression devices applied to her lower extremities while in the preoperative area.  She was then taken to the operating room. IV antibiotics were given. General anesthesia was administered via endotracheal route.  She was placed in the dorsal lithotomy position, and was prepped and draped in a sterile manner. A surgical time-out was performed.  A Foley catheter was inserted into her  bladder and attached to constant drainage.  A uterine sound was nestled into a tenaculum on the cervix. The gloves were changed, and attention was turned to the abdomen where an umbilical incision was made with the scalpel.  A 28mm trochar was inserted in the umbilical incision using a visiport method.Opening pressure was 22mmHg, and the abdomen was insufflated to 23mmHg carbon dioxide gas and adequate pneumoperitoneum was obtained. A survey of the patient's pelvis and abdomen revealed the findings as mentioned above.The decision was made to continue laparoscopically, with ability to convert to open if needed. Two 63mm ports were inserted in the lower left and right quadrants under visualization.    The left round ligament was transected with the Ligasure. The peritoneum was divided superiorly and inferiorly.  Superiorly along the IP ligament exposing and skeletonizing the vessels.  The ureter was not in the surgical field and the IP ligament was thrice cauterized and then transected.  The anterior broad ligament divided and brought across the uterus to separate the vesicouterine peritoneum and create a bladder flap. The bladder was pushed away from the uterus. The same steps were used on the right, with division of the round ligament exposure of the IP ligament and cauterization and transection of the IP ligament and anterior and posterior broad ligaments.  The bilateral uterine arteries and aberrant vessels were cauterized with the LigaSure and transected.  The cervix was then transected using the Bovie hook, and the uterine sound was removed.  The cervical canal was cauterized also using the Bovie hook.    A 5 cm Pfannenstiel incision was made, through which the Alexis retractor bag was placed into the abdomen.  The bag proved to be cumbersome and we were unable to place the fibroid uterus into the bag.  The bag was then removed, and the Alexis retractor placed into the incision.  Using the exCITE   technique, the uterus was morcellated and removed from the abdominal cavity without difficulty.  The fascia of this incision was closed with 0-vicryl.   The pneumoperitoneum was recreated, the bilateral ureters were visualized vermiuclating. No intraoperative injury to surrounding organs was noted. The abdomen was desufflated and all instruments were then removed.   The suprapubic incision was injected with 20 cc of Exparel.  The subcutaneous tissue reapproximated with 3-0 Vicryl.  All skin incisions were closed with 4-0 monocryl and covered with surgical glue. The patient tolerated the procedures well. The foley catheter was removed.  All instruments, needles, and sponge counts were correct x 2. The patient was taken to the recovery room in stable condition.   Due to this size of the uterus, the level of difficulty of this case exceeded the typical duration and skill level needed to complete this hysterectomy.  A modifier has been added to reflect this.   ---- Larey Days, MD Attending Obstetrician and Gynecologist [facility]

## 2020-07-27 NOTE — Transfer of Care (Signed)
Immediate Anesthesia Transfer of Care Note  Patient: Erica Fuller  Procedure(s) Performed: LAPAROSCOPIC SUPRACERVICAL HYSTERECTOMY (N/A ) LAPAROSCOPIC BILATERAL SALPINGO OOPHORECTOMY (Bilateral )  Patient Location: PACU  Anesthesia Type:General  Level of Consciousness: awake, alert  and oriented  Airway & Oxygen Therapy: Patient connected to face mask oxygen  Post-op Assessment: Post -op Vital signs reviewed and stable  Post vital signs: stable  Last Vitals:  Vitals Value Taken Time  BP 106/74 07/27/20 1716  Temp    Pulse 80 07/27/20 1720  Resp 12 07/27/20 1720  SpO2 98 % 07/27/20 1720  Vitals shown include unvalidated device data.  Last Pain:  Vitals:   07/27/20 1131  TempSrc: Tympanic  PainSc: 0-No pain         Complications: No complications documented.

## 2020-07-27 NOTE — Anesthesia Procedure Notes (Signed)
Procedure Name: Intubation Performed by: Fletcher-Harrison, Rockie Vawter, CRNA Pre-anesthesia Checklist: Patient identified, Emergency Drugs available, Suction available and Patient being monitored Patient Re-evaluated:Patient Re-evaluated prior to induction Oxygen Delivery Method: Circle system utilized Preoxygenation: Pre-oxygenation with 100% oxygen Induction Type: IV induction Ventilation: Mask ventilation without difficulty Laryngoscope Size: McGraph and 3 Grade View: Grade I Tube type: Oral Tube size: 7.0 mm Number of attempts: 1 Airway Equipment and Method: Stylet and Oral airway Placement Confirmation: ETT inserted through vocal cords under direct vision,  positive ETCO2,  breath sounds checked- equal and bilateral and CO2 detector Secured at: 21 cm Tube secured with: Tape Dental Injury: Teeth and Oropharynx as per pre-operative assessment        

## 2020-07-27 NOTE — Anesthesia Preprocedure Evaluation (Signed)
Anesthesia Evaluation  Patient identified by MRN, date of birth, ID band Patient awake    Reviewed: Allergy & Precautions, NPO status , Patient's Chart, lab work & pertinent test results  History of Anesthesia Complications (+) PONV and history of anesthetic complications (pt reports nausea resolved with zofran)  Airway Mallampati: II  TM Distance: >3 FB Neck ROM: Full    Dental no notable dental hx.    Pulmonary neg sleep apnea,    breath sounds clear to auscultation- rhonchi (-) wheezing      Cardiovascular Exercise Tolerance: Good (-) hypertension(-) CAD, (-) Past MI, (-) Cardiac Stents and (-) CABG  Rhythm:Regular Rate:Normal - Systolic murmurs and - Diastolic murmurs    Neuro/Psych neg Seizures PSYCHIATRIC DISORDERS Anxiety Depression negative neurological ROS     GI/Hepatic Neg liver ROS, GERD  ,  Endo/Other  negative endocrine ROSneg diabetes  Renal/GU negative Renal ROS     Musculoskeletal negative musculoskeletal ROS (+)   Abdominal (+) + obese,   Peds  Hematology  (+) anemia ,   Anesthesia Other Findings Past Medical History: No date: Anemia     Comment:  2 units of blood after miscarriage No date: Anxiety No date: Asthma     Comment:  past hx No date: Depression No date: Encounter for blood transfusion No date: Fibroid No date: GERD (gastroesophageal reflux disease) No date: Menorrhagia No date: Pneumonia     Comment:  2019 No date: PONV (postoperative nausea and vomiting)     Comment:  lots of itching post- op   Reproductive/Obstetrics                             Anesthesia Physical Anesthesia Plan  ASA: II  Anesthesia Plan: General   Post-op Pain Management:    Induction: Intravenous  PONV Risk Score and Plan: 3 and Ondansetron, Dexamethasone and Midazolam  Airway Management Planned: Oral ETT  Additional Equipment:   Intra-op Plan:   Post-operative  Plan: Extubation in OR  Informed Consent: I have reviewed the patients History and Physical, chart, labs and discussed the procedure including the risks, benefits and alternatives for the proposed anesthesia with the patient or authorized representative who has indicated his/her understanding and acceptance.     Dental advisory given  Plan Discussed with: CRNA and Anesthesiologist  Anesthesia Plan Comments:         Anesthesia Quick Evaluation

## 2020-07-27 NOTE — Anesthesia Postprocedure Evaluation (Signed)
Anesthesia Post Note  Patient: Erica Fuller  Procedure(s) Performed: LAPAROSCOPIC SUPRACERVICAL HYSTERECTOMY (N/A ) LAPAROSCOPIC BILATERAL SALPINGO OOPHORECTOMY (Bilateral )  Patient location during evaluation: PACU Anesthesia Type: General Level of consciousness: awake and alert Pain management: pain level controlled Vital Signs Assessment: post-procedure vital signs reviewed and stable Respiratory status: spontaneous breathing, nonlabored ventilation, respiratory function stable and patient connected to nasal cannula oxygen Cardiovascular status: blood pressure returned to baseline and stable Postop Assessment: no apparent nausea or vomiting Anesthetic complications: no   No complications documented.   Last Vitals:  Vitals:   07/27/20 1801 07/27/20 1811  BP: 105/66 114/73  Pulse: 85 91  Resp: 18 15  Temp:  36.9 C  SpO2: 94% 95%    Last Pain:  Vitals:   07/27/20 1801  TempSrc:   PainSc: [name]

## 2020-07-28 ENCOUNTER — Encounter: Payer: Self-pay | Admitting: Emergency Medicine

## 2020-07-28 ENCOUNTER — Other Ambulatory Visit: Payer: Self-pay

## 2020-07-28 ENCOUNTER — Other Ambulatory Visit (HOSPITAL_COMMUNITY): Payer: Self-pay | Admitting: Obstetrics & Gynecology

## 2020-07-28 ENCOUNTER — Ambulatory Visit
Admission: RE | Admit: 2020-07-28 | Discharge: 2020-07-28 | Disposition: A | Discharge status: Home / Self Care | Payer: No Typology Code available for payment source | Source: Ambulatory Visit | Attending: Obstetrics & Gynecology | Admitting: Obstetrics & Gynecology

## 2020-07-28 ENCOUNTER — Emergency Department
Admission: EM | Admit: 2020-07-28 | Discharge: 2020-07-28 | Disposition: A | Discharge status: Home / Self Care | Payer: No Typology Code available for payment source | Attending: Emergency Medicine | Admitting: Emergency Medicine

## 2020-07-28 ENCOUNTER — Emergency Department: Payer: No Typology Code available for payment source

## 2020-07-28 ENCOUNTER — Other Ambulatory Visit: Payer: Self-pay | Admitting: Obstetrics & Gynecology

## 2020-07-28 DIAGNOSIS — Z91040 Latex allergy status: Secondary | ICD-10-CM | POA: Insufficient documentation

## 2020-07-28 DIAGNOSIS — J45909 Unspecified asthma, uncomplicated: Secondary | ICD-10-CM | POA: Insufficient documentation

## 2020-07-28 DIAGNOSIS — R109 Unspecified abdominal pain: Secondary | ICD-10-CM | POA: Insufficient documentation

## 2020-07-28 DIAGNOSIS — M25511 Pain in right shoulder: Secondary | ICD-10-CM | POA: Diagnosis not present

## 2020-07-28 DIAGNOSIS — R110 Nausea: Secondary | ICD-10-CM | POA: Diagnosis not present

## 2020-07-28 DIAGNOSIS — R0602 Shortness of breath: Secondary | ICD-10-CM | POA: Diagnosis not present

## 2020-07-28 DIAGNOSIS — M25512 Pain in left shoulder: Secondary | ICD-10-CM | POA: Insufficient documentation

## 2020-07-28 DIAGNOSIS — G8918 Other acute postprocedural pain: Secondary | ICD-10-CM

## 2020-07-28 LAB — CBC WITH DIFFERENTIAL/PLATELET
Abs Immature Granulocytes: 0.05 10*3/uL (ref 0.00–0.07)
Basophils Absolute: 0 10*3/uL (ref 0.0–0.1)
Basophils Relative: 0 %
Eosinophils Absolute: 0 10*3/uL (ref 0.0–0.5)
Eosinophils Relative: 0 %
HCT: 39.7 % (ref 36.0–46.0)
Hemoglobin: 13.5 g/dL (ref 12.0–15.0)
Immature Granulocytes: 0 %
Lymphocytes Relative: 13 %
Lymphs Abs: 2 10*3/uL (ref 0.7–4.0)
MCH: 29 pg (ref 26.0–34.0)
MCHC: 34 g/dL (ref 30.0–36.0)
MCV: 85.4 fL (ref 80.0–100.0)
Monocytes Absolute: 0.7 10*3/uL (ref 0.1–1.0)
Monocytes Relative: 4 %
Neutro Abs: 12.8 10*3/uL — ABNORMAL HIGH (ref 1.7–7.7)
Neutrophils Relative %: 83 %
Platelets: 327 10*3/uL (ref 150–400)
RBC: 4.65 MIL/uL (ref 3.87–5.11)
RDW: 12.8 % (ref 11.5–15.5)
WBC: 15.5 10*3/uL — ABNORMAL HIGH (ref 4.0–10.5)
nRBC: 0 % (ref 0.0–0.2)

## 2020-07-28 LAB — COMPREHENSIVE METABOLIC PANEL
ALT: 19 U/L (ref 0–44)
AST: 27 U/L (ref 15–41)
Albumin: 3.8 g/dL (ref 3.5–5.0)
Alkaline Phosphatase: 58 U/L (ref 38–126)
Anion gap: 9 (ref 5–15)
BUN: 13 mg/dL (ref 6–20)
CO2: 24 mmol/L (ref 22–32)
Calcium: 8.7 mg/dL — ABNORMAL LOW (ref 8.9–10.3)
Chloride: 98 mmol/L (ref 98–111)
Creatinine, Ser: 0.72 mg/dL (ref 0.44–1.00)
GFR, Estimated: >60 mL/min (ref >60)
Glucose, Bld: 132 mg/dL — ABNORMAL HIGH (ref 70–99)
Potassium: 4.2 mmol/L (ref 3.5–5.1)
Sodium: 131 mmol/L — ABNORMAL LOW (ref 135–145)
Total Bilirubin: 0.8 mg/dL (ref 0.3–1.2)
Total Protein: 7.1 g/dL (ref 6.5–8.1)

## 2020-07-28 LAB — LACTIC ACID, PLASMA: Lactic Acid, Venous: 1.4 mmol/L (ref 0.5–1.9)

## 2020-07-28 MED ORDER — IOHEXOL 300 MG/ML  SOLN
100.0000 mL | Freq: Once | INTRAMUSCULAR | Status: AC | PRN
  Administered 2020-07-28: 100 mL via INTRAVENOUS

## 2020-07-28 MED ORDER — ONDANSETRON 4 MG PO TBDP: ORAL_TABLET | ORAL | 0 refills | Status: DC

## 2020-07-28 MED ORDER — MORPHINE SULFATE (PF) 4 MG/ML IV SOLN
4.0000 mg | Freq: Once | INTRAVENOUS | Status: AC
  Administered 2020-07-28: 4 mg via INTRAVENOUS
  Filled 2020-07-28: qty 1

## 2020-07-28 MED ORDER — ONDANSETRON HCL 4 MG/2ML IJ SOLN
4.0000 mg | Freq: Once | INTRAMUSCULAR | Status: AC
  Administered 2020-07-28: 4 mg via INTRAVENOUS
  Filled 2020-07-28: qty 2

## 2020-07-28 NOTE — ED Provider Notes (Signed)
[facility] Emergency Department Provider Note  ____________________________________________   First MD Initiated Contact with Patient 07/28/20 0540     (approximate)  I have reviewed the triage vital signs and the nursing notes.   HISTORY  Chief Complaint Post-op Problem and Shortness of Breath    HPI Erica Fuller is a 49 y.o. female who is less than 12 hours postop from laparoscopic hysterectomy and bilateral salpingo-oophorectomy by Dr. Leonides Schanz.  She presents tonight for evaluation of pain.  She reports that since going home from the hospital at about 7 PM yesterday she has had gradually worsening pain.  By the middle the night it was unbearable in spite of taking 2 oxycodone.  She started feeling short of breath as well and the pain in her abdomen seem to be radiating up to her shoulders.  Nothing in particular made it better and moving around made it worse.  She also reports nausea but no vomiting.  She has not had a bowel movement since the surgery.  She denies fever, sore throat, and dysuria.         Past Medical History:  Diagnosis Date  . Anemia    2 units of blood after miscarriage  . Anxiety   . Asthma    past hx  . Depression   . Encounter for blood transfusion   . Fibroid   . GERD (gastroesophageal reflux disease)   . Menorrhagia   . Pneumonia    2019  . PONV (postoperative nausea and vomiting)    lots of itching post- op    There are no problems to display for this patient.   Past Surgical History:  Procedure Laterality Date  . ABDOMINAL HYSTERECTOMY    . APPENDECTOMY    . CESAREAN SECTION    . DIAGNOSTIC LAPAROSCOPY      Pelvic laparoscopy   . DILATION AND CURETTAGE OF UTERUS    . ENDOMETRIAL ABLATION    . GYNECOLOGIC CRYOSURGERY    . LAPAROSCOPIC BILATERAL SALPINGO OOPHERECTOMY Bilateral 07/27/2020   Procedure: LAPAROSCOPIC BILATERAL SALPINGO OOPHORECTOMY;  Surgeon: Ward, Honor Loh, MD;  Location: [facility] ORS;  Service:  Gynecology;  Laterality: Bilateral;  . LAPAROSCOPIC GELPORT ASSISTED MYOMECTOMY    . LAPAROSCOPIC SUPRACERVICAL HYSTERECTOMY N/A 07/27/2020   Procedure: LAPAROSCOPIC SUPRACERVICAL HYSTERECTOMY;  Surgeon: Ward, Honor Loh, MD;  Location: [facility] ORS;  Service: Gynecology;  Laterality: N/A;  . TONSILLECTOMY    . TUBAL LIGATION    . VULVECTOMY    . WISDOM TOOTH EXTRACTION      Prior to Admission medications   Medication Sig Start Date End Date Taking? Authorizing Provider  acetaminophen (TYLENOL) 500 MG tablet Take 2 tablets (1,000 mg total) by mouth every 6 (six) hours. 07/27/20 07/27/21  Ward, Honor Loh, MD  cyclobenzaprine (FLEXERIL) 10 MG tablet Take 10 mg by mouth daily as needed for muscle spasms. 07/03/20   [provider]  escitalopram (LEXAPRO) 20 MG tablet Take 20 mg by mouth daily. 07/03/20   [provider]  estradiol (CLIMARA - DOSED IN MG/24 HR) 0.025 mg/24hr patch Place 1 patch (0.025 mg total) onto the skin once a week. 07/27/20 07/27/21  Ward, Honor Loh, MD  gabapentin (NEURONTIN) 100 MG capsule Take 200 mg by mouth at bedtime. 04/01/20   [provider]  ibuprofen (ADVIL) 800 MG tablet Take 1 tablet (800 mg total) by mouth every 6 (six) hours. 07/27/20   Ward, Honor Loh, MD  omeprazole (PRILOSEC) 20 MG capsule Take 20  mg by mouth daily.    [provider]  ondansetron (ZOFRAN ODT) 4 MG disintegrating tablet Allow 1-2 tablets to dissolve in your mouth every 8 hours as needed for nausea/vomiting 07/28/20   Hinda Kehr, MD  oxyCODONE (ROXICODONE) 5 MG immediate release tablet Take 1 tablet (5 mg total) by mouth every 4 (four) hours as needed. 07/27/20 07/27/21  Ward, Honor Loh, MD    Allergies Latex, Fentanyl, Morphine, Codeine, and Sulfa antibiotics  Family History  Problem Relation Age of Onset  . Breast cancer Maternal Grandmother 29    Social History Social History   Tobacco Use  . Smoking status: Never Smoker  . Smokeless tobacco:  Never Used  Vaping Use  . Vaping Use: Never used  Substance Use Topics  . Alcohol use: Yes    Comment: socially  . Drug use: Not Currently    Review of Systems Constitutional: No fever/chills Eyes: No visual changes. ENT: No sore throat. Cardiovascular: Pain seems to radiate from the abdomen into her chest. Respiratory: Shortness of breath versus shallow breaths due to the abdominal pain. Gastrointestinal: Abdominal pain and nausea. Genitourinary: Negative for dysuria. Musculoskeletal: Negative for neck pain.  Negative for back pain. Integumentary: Negative for rash. Neurological: Negative for headaches, focal weakness or numbness.   ____________________________________________   PHYSICAL EXAM:  VITAL SIGNS: ED Triage Vitals  Enc Vitals Group     BP 07/28/20 0507 115/71     Pulse Rate 07/28/20 0507 74     Resp 07/28/20 0507 16     Temp 07/28/20 0507 98.9 F (37.2 C)     Temp Source 07/28/20 0507 Oral     SpO2 07/28/20 0449 93 %     Weight 07/28/20 0510 104.3 kg (230 lb)     Height 07/28/20 0510 1.778 m (5\' 10" )     Head Circumference --      Peak Flow --      Pain Score 07/28/20 0510 6     Pain Loc --      Pain Edu? --      Excl. in [place]? --     Constitutional: Alert and oriented.  Eyes: Conjunctivae are normal.  Head: Atraumatic. Nose: No congestion/rhinnorhea. Mouth/Throat: Patient is wearing a mask. Neck: No stridor.  No meningeal signs.   Cardiovascular: Normal rate, regular rhythm. Good peripheral circulation. Grossly normal heart sounds. Respiratory: Normal respiratory effort.  No retractions. Gastrointestinal: Abdominal binder still in place.  Tenderness to palpation of the abdomen to a degree anticipated by the fact she is so recently postop.  Nonperitoneal exam. Musculoskeletal: No lower extremity tenderness nor edema. No gross deformities of extremities. Neurologic:  Normal speech and language. No gross focal neurologic deficits are appreciated.    Skin:  Skin is warm, dry and intact. Psychiatric: Mood and affect are normal. Speech and behavior are normal.  ____________________________________________   LABS (all labs ordered are listed, but only abnormal results are displayed)  Labs Reviewed  CBC WITH DIFFERENTIAL/PLATELET - Abnormal; Notable for the following components:      Result Value   WBC 15.5 (*)    Neutro Abs 12.8 (*)    All other components within normal limits  COMPREHENSIVE METABOLIC PANEL - Abnormal; Notable for the following components:   Sodium 131 (*)    Glucose, Bld 132 (*)    Calcium 8.7 (*)    All other components within normal limits  LACTIC ACID, PLASMA  LACTIC ACID, PLASMA   ____________________________________________  EKG  None -  EKG not ordered by ED physician ____________________________________________  RADIOLOGY I, Hinda Kehr, personally viewed and evaluated these images (plain radiographs) as part of my medical decision making, as well as reviewing the written report by the radiologist.  ED MD interpretation: Bibasilar atelectasis, and no evidence of acute infectious process or other abnormality  Official radiology report(s): DG Chest Portable 1 View  Result Date: 07/28/2020 CLINICAL DATA:  Shortness of breath. EXAM: PORTABLE CHEST 1 VIEW COMPARISON:  No prior. FINDINGS: Mediastinum and hilar structures normal. Borderline cardiomegaly. No pulmonary venous congestion. Low lung volumes with bibasilar atelectasis. No prominent pleural effusion. No pneumothorax. No acute bony abnormality. IMPRESSION: 1. Borderline cardiomegaly. No pulmonary venous congestion. 2. Low lung volumes with bibasilar atelectasis. Electronically Signed   By: Marcello Moores  Register   On: 07/28/2020 05:38    ____________________________________________   PROCEDURES   Procedure(s) performed (including Critical Care):  Procedures   ____________________________________________   INITIAL IMPRESSION / MDM /  [place] / ED COURSE  As part of my medical decision making, I reviewed the following data within the [place] notes reviewed and incorporated, Labs reviewed , Old chart reviewed, Discussed with OB/GYN (Dr. Leafy Ro), reviewed Notes from prior ED visits and [place] Controlled Substance Database   Differential diagnosis includes, but is not limited to, postoperative pain particularly from laparoscopic insufflation, SBO/ileus, constipation, seroma/hematoma, less likely acute infection or pulmonary embolism.  The patient has a mild leukocytosis which is to be expected in the immediate postoperative phase.  Metabolic panel is normal.  Vital signs are stable and within normal limits.  The patient had a significant amount of pain initially which was almost completely relieved with morphine 4 mg IV.  She also received Zofran 4 mg IV which she states she thinks helped more than anything.  She says she feels like she would have no problem going home now.  Given the patient is so recently postop, I called and discussed the case by phone with Dr. Leafy Ro who is on-call for Dr. Leonides Schanz.  She agrees that based on the clinical picture I explained and provided to her, it sounds as if the patient has no evidence of emergent medical condition and that the patient should be appropriate for discharge with close follow-up with Dr. Leonides Schanz in clinic.  I sent a message through [facility] to Dr. Leonides Schanz and Dr. Leafy Ro to clarify and asked that the patient possibly be added to Dr. Leonides Schanz schedule within a couple of days for follow-up.  I gave the patient my usual and customary return precautions and a prescription for Zofran.  She understands and agrees with the plan.           ____________________________________________  FINAL CLINICAL IMPRESSION(S) / ED DIAGNOSES  Final diagnoses:  Post-operative pain     MEDICATIONS GIVEN DURING THIS VISIT:  Medications  ondansetron (ZOFRAN) injection 4 mg (4  mg Intravenous Given 07/28/20 0539)  morphine 4 MG/ML injection 4 mg (4 mg Intravenous Given 07/28/20 0617)     ED Discharge Orders         Ordered    ondansetron (ZOFRAN ODT) 4 MG disintegrating tablet        07/28/20 9381          *Please note:  Erica Fuller was evaluated in Emergency Department on 07/28/2020 for the symptoms described in the history of present illness. She was evaluated in the context of the global COVID-19 pandemic, which necessitated consideration that the patient might be at  risk for infection with the SARS-CoV-2 virus that causes COVID-19. Institutional protocols and algorithms that pertain to the evaluation of patients at risk for COVID-19 are in a state of rapid change based on information released by regulatory bodies including the CDC and federal and state organizations. These policies and algorithms were followed during the patient's care in the ED.  Some ED evaluations and interventions may be delayed as a result of limited staffing during and after the pandemic.*  Note:  This document was prepared using Dragon voice recognition software and may include unintentional dictation errors.   Hinda Kehr, MD 07/28/20 [phone]

## 2020-07-28 NOTE — ED Triage Notes (Signed)
Pt to ED via EMS from home c/o abd pain.  States had hysterectomy yesterday (Monday) and discharged around 1900.  States pain and nausea have gotten worse, SOB started about 2 hours ago.  States minimal vaginal bleeding.  Pt A&Ox4, chest rise even and unlabored in triage.  Pt placed on 2L [place] via EMS, not normally on oxygen.

## 2020-07-28 NOTE — Discharge Instructions (Signed)
Your workup in the Emergency Department today was reassuring.  We did not find any specific abnormalities.  We recommend you drink plenty of fluids, take your regular medications and/or any new ones prescribed today, and follow up with the doctor(s) listed in these documents as recommended.  Return to the Emergency Department if you develop new or worsening symptoms that concern you.  

## 2020-07-28 NOTE — ED Triage Notes (Signed)
EMS brings pt in from home; post hysterectomy yesterday; c/o rt lower abd pain and SHOB

## 2020-07-29 ENCOUNTER — Ambulatory Visit: Payer: No Typology Code available for payment source

## 2020-07-29 LAB — SURGICAL PATHOLOGY

## 2020-10-12 ENCOUNTER — Other Ambulatory Visit: Payer: Self-pay | Admitting: Family Medicine

## 2020-10-12 ENCOUNTER — Other Ambulatory Visit: Payer: Self-pay | Admitting: Physician Assistant

## 2020-10-12 DIAGNOSIS — Z1231 Encounter for screening mammogram for malignant neoplasm of breast: Secondary | ICD-10-CM

## 2020-11-03 ENCOUNTER — Ambulatory Visit: Payer: No Typology Code available for payment source

## 2020-11-09 ENCOUNTER — Ambulatory Visit: Payer: No Typology Code available for payment source

## 2020-11-24 ENCOUNTER — Other Ambulatory Visit: Payer: Self-pay

## 2020-11-24 ENCOUNTER — Ambulatory Visit
Admission: RE | Admit: 2020-11-24 | Discharge: 2020-11-24 | Disposition: A | Discharge status: Home / Self Care | Payer: No Typology Code available for payment source | Source: Ambulatory Visit | Attending: Physician Assistant | Admitting: Physician Assistant

## 2020-11-24 DIAGNOSIS — Z1231 Encounter for screening mammogram for malignant neoplasm of breast: Secondary | ICD-10-CM | POA: Diagnosis not present

## 2021-06-22 ENCOUNTER — Other Ambulatory Visit: Payer: Self-pay | Admitting: Podiatry

## 2021-07-08 ENCOUNTER — Encounter: Payer: Self-pay | Admitting: Podiatry

## 2021-07-16 NOTE — Discharge Instructions (Signed)
[facility]  POST OPERATIVE INSTRUCTIONS FOR DR. FOWLER AND [facility]   Take your medication as prescribed.  Pain medication should be taken only as needed.  Keep the dressing clean, dry and intact.  Keep your foot elevated above the heart level for the first 48 hours.  Walking to the bathroom and brief periods of walking are acceptable, unless we have instructed you to be non-weight bearing.  Always wear your post-op shoe when walking.  Always use your crutches if you are to be non-weight bearing.  Do not take a shower. Baths are permissible as long as the foot is kept out of the water.   Every hour you are awake:  Bend your knee 15 times. Flex foot 15 times Massage calf 15 times  Call [facility] ([phone]) if any of the following problems occur: You develop a temperature or fever. The bandage becomes saturated with blood. Medication does not stop your pain. Injury of the foot occurs. Any symptoms of infection including redness, odor, or red streaks running from wound. 

## 2021-07-21 ENCOUNTER — Encounter
Admission: RE | Disposition: A | Discharge status: Home / Self Care | Payer: Self-pay | Source: Home / Self Care | Attending: Podiatry

## 2021-07-21 ENCOUNTER — Ambulatory Visit
Admission: RE | Admit: 2021-07-21 | Discharge: 2021-07-21 | Disposition: A | Discharge status: Home / Self Care | Payer: No Typology Code available for payment source | Attending: Podiatry | Admitting: Podiatry

## 2021-07-21 ENCOUNTER — Ambulatory Visit: Payer: No Typology Code available for payment source | Admitting: Anesthesiology

## 2021-07-21 ENCOUNTER — Other Ambulatory Visit: Payer: Self-pay

## 2021-07-21 ENCOUNTER — Encounter: Payer: Self-pay | Admitting: Podiatry

## 2021-07-21 DIAGNOSIS — K219 Gastro-esophageal reflux disease without esophagitis: Secondary | ICD-10-CM | POA: Insufficient documentation

## 2021-07-21 DIAGNOSIS — M722 Plantar fascial fibromatosis: Secondary | ICD-10-CM | POA: Diagnosis present

## 2021-07-21 HISTORY — PX: PLANTAR FASCIA RELEASE: SHX2239

## 2021-07-21 HISTORY — PX: TARSAL TUNNEL RELEASE: SHX5042

## 2021-07-21 SURGERY — RELEASE, TARSAL TUNNEL
Anesthesia: General | Site: Foot | Laterality: Left

## 2021-07-21 MED ORDER — BUPIVACAINE-EPINEPHRINE (PF) 0.25% -1:200000 IJ SOLN
INTRAMUSCULAR | Status: DC | PRN
  Administered 2021-07-21: 10 mL

## 2021-07-21 MED ORDER — PROPOFOL 10 MG/ML IV BOLUS
INTRAVENOUS | Status: DC | PRN
  Administered 2021-07-21: 200 mg via INTRAVENOUS

## 2021-07-21 MED ORDER — BUPIVACAINE LIPOSOME 1.3 % IJ SUSP
INTRAMUSCULAR | Status: DC | PRN
  Administered 2021-07-21 (×2): 10 mL

## 2021-07-21 MED ORDER — ONDANSETRON HCL 4 MG/2ML IJ SOLN: 4.0000 mg | Freq: Once | INTRAMUSCULAR | Status: DC | PRN

## 2021-07-21 MED ORDER — OXYCODONE-ACETAMINOPHEN 5-325 MG PO TABS: 1.0000 | ORAL_TABLET | Freq: Four times a day (QID) | ORAL | 0 refills | Status: DC | PRN

## 2021-07-21 MED ORDER — LIDOCAINE HCL (CARDIAC) PF 100 MG/5ML IV SOSY
PREFILLED_SYRINGE | INTRAVENOUS | Status: DC | PRN
  Administered 2021-07-21: 50 mg via INTRATRACHEAL

## 2021-07-21 MED ORDER — SCOPOLAMINE 1 MG/3DAYS TD PT72
1.0000 | MEDICATED_PATCH | Freq: Once | TRANSDERMAL | Status: DC
  Administered 2021-07-21: 1.5 mg via TRANSDERMAL

## 2021-07-21 MED ORDER — ACETAMINOPHEN 160 MG/5ML PO SOLN: 325.0000 mg | ORAL | Status: DC | PRN

## 2021-07-21 MED ORDER — GLYCOPYRROLATE 0.2 MG/ML IJ SOLN
INTRAMUSCULAR | Status: DC | PRN
  Administered 2021-07-21: .1 mg via INTRAVENOUS

## 2021-07-21 MED ORDER — ONDANSETRON HCL 4 MG PO TABS: 4.0000 mg | ORAL_TABLET | Freq: Every day | ORAL | 1 refills | Status: AC | PRN

## 2021-07-21 MED ORDER — MIDAZOLAM HCL 5 MG/5ML IJ SOLN
INTRAMUSCULAR | Status: DC | PRN
  Administered 2021-07-21: 2 mg via INTRAVENOUS

## 2021-07-21 MED ORDER — AMISULPRIDE (ANTIEMETIC) 5 MG/2ML IV SOLN
10.0000 mg | Freq: Once | INTRAVENOUS | Status: AC
  Administered 2021-07-21: 10 mg via INTRAVENOUS

## 2021-07-21 MED ORDER — CEFAZOLIN SODIUM-DEXTROSE 2-4 GM/100ML-% IV SOLN
2.0000 g | INTRAVENOUS | Status: AC
  Administered 2021-07-21: 2 g via INTRAVENOUS

## 2021-07-21 MED ORDER — ACETAMINOPHEN 325 MG PO TABS: 325.0000 mg | ORAL_TABLET | ORAL | Status: DC | PRN

## 2021-07-21 MED ORDER — DEXAMETHASONE SODIUM PHOSPHATE 4 MG/ML IJ SOLN
INTRAMUSCULAR | Status: DC | PRN
  Administered 2021-07-21: 4 mg via INTRAVENOUS

## 2021-07-21 MED ORDER — OXYCODONE HCL 5 MG/5ML PO SOLN: 5.0000 mg | Freq: Once | ORAL | Status: AC | PRN

## 2021-07-21 MED ORDER — LACTATED RINGERS IV SOLN: INTRAVENOUS | Status: DC

## 2021-07-21 MED ORDER — POVIDONE-IODINE 7.5 % EX SOLN: Freq: Once | CUTANEOUS | Status: AC

## 2021-07-21 MED ORDER — ONDANSETRON HCL 4 MG/2ML IJ SOLN
INTRAMUSCULAR | Status: DC | PRN
  Administered 2021-07-21 (×2): 4 mg via INTRAVENOUS

## 2021-07-21 MED ORDER — OXYCODONE HCL 5 MG PO TABS
5.0000 mg | ORAL_TABLET | Freq: Once | ORAL | Status: AC | PRN
  Administered 2021-07-21: 5 mg via ORAL

## 2021-07-21 SURGICAL SUPPLY — 32 items
BNDG COHESIVE 4X5 TAN ST LF (GAUZE/BANDAGES/DRESSINGS) ×2 IMPLANT
BNDG ESMARK 4X12 TAN STRL LF (GAUZE/BANDAGES/DRESSINGS) ×2 IMPLANT
BNDG STRETCH 4X75 STRL LF (GAUZE/BANDAGES/DRESSINGS) ×2 IMPLANT
BOOT STEPPER DURA MED (SOFTGOODS) ×2 IMPLANT
COVER LIGHT HANDLE UNIVERSAL (MISCELLANEOUS) ×4 IMPLANT
DURAPREP 26ML APPLICATOR (WOUND CARE) ×2 IMPLANT
ELECT REM PT RETURN 9FT ADLT (ELECTROSURGICAL) ×2
ELECTRODE REM PT RTRN 9FT ADLT (ELECTROSURGICAL) ×1 IMPLANT
GAUZE SPONGE 4X4 12PLY STRL (GAUZE/BANDAGES/DRESSINGS) IMPLANT
GAUZE XEROFORM 1X8 LF (GAUZE/BANDAGES/DRESSINGS) IMPLANT
GAUZE XEROFORM 5X9 LF (GAUZE/BANDAGES/DRESSINGS) ×2 IMPLANT
GLOVE SURG ENC MOIS LTX SZ7.5 (GLOVE) ×4 IMPLANT
GLOVE SURG UNDER LTX SZ8 (GLOVE) ×4 IMPLANT
GOWN STRL REUS W/ TWL LRG LVL3 (GOWN DISPOSABLE) ×2 IMPLANT
GOWN STRL REUS W/TWL LRG LVL3 (GOWN DISPOSABLE) ×2
IV NS 250ML (IV SOLUTION) ×1
IV NS 250ML BAXH (IV SOLUTION) ×1 IMPLANT
KIT CARPAL TUNNEL (MISCELLANEOUS) ×1
KIT PRC PRB RTRGD 3ANG KNF HND (MISCELLANEOUS) ×1 IMPLANT
KIT TURNOVER KIT A (KITS) ×2 IMPLANT
NEEDLE 18GX1X1/2 (RX/OR ONLY) (NEEDLE) ×2 IMPLANT
NEEDLE HYPO 25GX1X1/2 BEV (NEEDLE) ×2 IMPLANT
NS IRRIG 500ML POUR BTL (IV SOLUTION) ×2 IMPLANT
PACK EXTREMITY ARMC (MISCELLANEOUS) ×2 IMPLANT
SOL ANTI-FOG 6CC FOG-OUT (MISCELLANEOUS) ×1 IMPLANT
SOL FOG-OUT ANTI-FOG 6CC (MISCELLANEOUS) ×1
SPLINT CAST 1 STEP 4X30 (MISCELLANEOUS) IMPLANT
STOCKINETTE IMPERVIOUS LG (DRAPES) ×2 IMPLANT
SUT ETHILON 3-0 (SUTURE) ×2 IMPLANT
SUT VIC AB 4-0 FS2 27 (SUTURE) IMPLANT
SYR 10ML LL (SYRINGE) ×4 IMPLANT
WAND TENDON TOPAZ 0 ANGL (MISCELLANEOUS) ×2 IMPLANT

## 2021-07-21 NOTE — Anesthesia Preprocedure Evaluation (Signed)
Anesthesia Evaluation  Patient identified by MRN, date of birth, ID band Patient awake    Reviewed: Allergy & Precautions, H&P , NPO status , Patient's Chart, lab work & pertinent test results  History of Anesthesia Complications (+) PONV and history of anesthetic complications  Airway Mallampati: II  TM Distance: >3 FB Neck ROM: full    Dental no notable dental hx.    Pulmonary asthma ,    Pulmonary exam normal breath sounds clear to auscultation       Cardiovascular Normal cardiovascular exam Rhythm:regular Rate:Normal     Neuro/Psych PSYCHIATRIC DISORDERS    GI/Hepatic GERD  ,  Endo/Other    Renal/GU      Musculoskeletal   Abdominal   Peds  Hematology   Anesthesia Other Findings   Reproductive/Obstetrics                             Anesthesia Physical Anesthesia Plan  ASA: 2  Anesthesia Plan: General   Post-op Pain Management:    Induction: Intravenous  PONV Risk Score and Plan: 4 or greater and Treatment may vary due to age or medical condition, Ondansetron, Dexamethasone, Scopolamine patch - Pre-op and Midazolam  Airway Management Planned: LMA  Additional Equipment:   Intra-op Plan:   Post-operative Plan:   Informed Consent: I have reviewed the patients History and Physical, chart, labs and discussed the procedure including the risks, benefits and alternatives for the proposed anesthesia with the patient or authorized representative who has indicated his/her understanding and acceptance.     Dental Advisory Given  Plan Discussed with: CRNA  Anesthesia Plan Comments:         Anesthesia Quick Evaluation

## 2021-07-21 NOTE — H&P (Signed)
HISTORY AND PHYSICAL INTERVAL NOTE:  07/21/2021  12:56 PM  Erica Fuller  has presented today for surgery, with the diagnosis of M72.2- Plantar fasciitis.  The various methods of treatment have been discussed with the patient.  No guarantees were given.  After consideration of risks, benefits and other options for treatment, the patient has consented to surgery.  I have reviewed the patients' chart and labs.     A history and physical examination was performed in my office.  The patient was reexamined.  There have been no changes to this history and physical examination.  Samara Deist A

## 2021-07-21 NOTE — Anesthesia Procedure Notes (Signed)
Procedure Name: LMA Insertion Date/Time: 07/21/2021 1:11 PM Performed by: Mayme Genta, CRNA Pre-anesthesia Checklist: Patient identified, Emergency Drugs available, Suction available, Timeout performed and Patient being monitored Patient Re-evaluated:Patient Re-evaluated prior to induction Oxygen Delivery Method: Circle system utilized Preoxygenation: Pre-oxygenation with 100% oxygen Induction Type: IV induction LMA: LMA inserted LMA Size: 4.0 Number of attempts: 1 Placement Confirmation: positive ETCO2 and breath sounds checked- equal and bilateral Tube secured with: Tape

## 2021-07-21 NOTE — Op Note (Signed)
Operative note   Surgeon:Kyliegh Jester    Assistant:None    Preop diagnosis: 1.  Plantar fasciitis left heel 2.  Baxters nerve entrapment left heel    Postop diagnosis: Same    Procedure: 1.  Endoscopic plantar fasciotomy left heel 2.  Baxters nerve release left heel    EBL: Minimal    Anesthesia:local and general    Hemostasis: Mid calf tourniquet inflated to 200 mmHg for 27 minutes    Specimen: None    Complications: None    Operative indications:Erica Fuller is an 50 y.o. that presents today for surgical intervention.  The risks/benefits/alternatives/complications have been discussed and consent has been given.    Procedure:  Patient was brought into the OR and placed on the operating table in thesupine position. After anesthesia was obtained theleft lower extremity was prepped and draped in usual sterile fashion.  Attention was directed to the medial aspect of the left heel where a small stab incision was performed.  Blunt dissection carried down to the plantar fascia.  Plantar fascial elevator was used.  Next the blunt trocar and cannula was introduced from medial to lateral.  A small stab incision was performed laterally.  The blunt trocar was removed.  Next the plantar fascial was evaluated.  The medial one half was then incised.  The deep muscle belly was noted.  The wound was flushed with copious amounts of irrigation.  The blunt trocar and cannula were then removed at this time.  Next a medial incision was placed more proximal along the distal tarsal tunnel region.  The superficial fascia was incised.  The muscle belly was retracted both dorsal and plantar and the deep fascia incised.  Further deep inspection revealed a smaller deep fascial layer and upon removal of this the deep adipose tissue along Baxters nerve was noted.  This was freed of all fascial material.  The wound was flushed with copious amounts of irrigation.  Closure was performed for the subcutaneous tissue  with a 4-0 Vicryl and the skin with a 3-0 nylon.  Finally a small gridlike was placed into the plantar medial heel in the Topaz wand was used to infiltrate the ligament in this area.  A bulky sterile dressing was applied to the left foot.  She was then placed in an equalizer walker boot with the foot at 90 degrees.    Patient tolerated the procedure and anesthesia well.  Was transported from the OR to the PACU with all vital signs stable and vascular status intact. To be discharged per routine protocol.  Will follow up in approximately 1 week in the outpatient clinic.  Prescription for Percocet was provided.  Patient states she is able to take this.  She will take Benadryl associated with the minor itching that she describes with narcotics

## 2021-07-21 NOTE — Anesthesia Postprocedure Evaluation (Signed)
Anesthesia Post Note  Patient: Erica Fuller  Procedure(s) Performed: TARSAL TUNNEL RELEASE (Left: Foot) PLANTAR FASCIA RELEASE (Left: Foot)     Patient location during evaluation: PACU Anesthesia Type: General Level of consciousness: awake and alert and oriented Pain management: satisfactory to patient Vital Signs Assessment: post-procedure vital signs reviewed and stable Respiratory status: spontaneous breathing, nonlabored ventilation and respiratory function stable Cardiovascular status: blood pressure returned to baseline and stable Postop Assessment: Adequate PO intake and No signs of nausea or vomiting Anesthetic complications: no   No notable events documented.  Raliegh Ip

## 2021-07-21 NOTE — Transfer of Care (Signed)
Immediate Anesthesia Transfer of Care Note  Patient: Erica Fuller  Procedure(s) Performed: TARSAL TUNNEL RELEASE (Left: Foot) PLANTAR FASCIA RELEASE (Left: Foot)  Patient Location: PACU  Anesthesia Type: General  Level of Consciousness: awake, alert  and patient cooperative  Airway and Oxygen Therapy: Patient Spontanous Breathing and Patient connected to supplemental oxygen  Post-op Assessment: Post-op Vital signs reviewed, Patient's Cardiovascular Status Stable, Respiratory Function Stable, Patent Airway and No signs of Nausea or vomiting  Post-op Vital Signs: Reviewed and stable  Complications: No notable events documented.

## 2021-10-20 IMAGING — CT CT ABD-PELV W/ CM
2 of 5 series · 16 of 46 positions shown, 18 images · IV contrast (omnipaque)
Comparison: None.

CLINICAL DATA: Left lower quadrant pain. The patient is status post
hysterectomy yesterday.

EXAM:
CT ABDOMEN AND PELVIS WITH CONTRAST
TECHNIQUE: Multidetector CT imaging of the abdomen and pelvis was performed
using the standard protocol following bolus administration of
intravenous contrast.
CONTRAST:  100 mL OMNIPAQUE IOHEXOL 300 MG/ML  SOLN

[Series 2: abd pelvis 5.00 · axial · 0.85mm/px · z∈[-1614,-1149]mm · 13 of 109 slices shown, 15 images]
[im 8/109  soft-tissue]
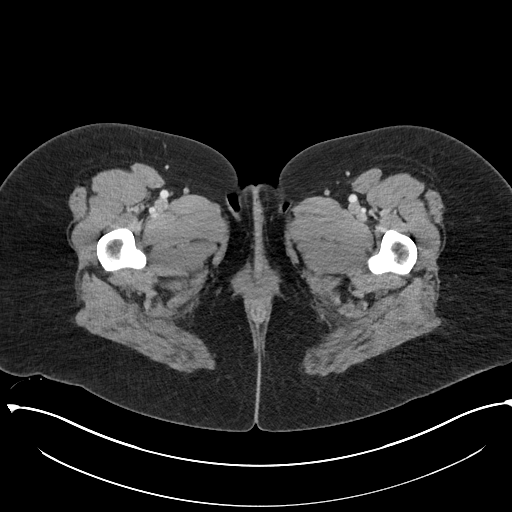
[im 8/109  bone]
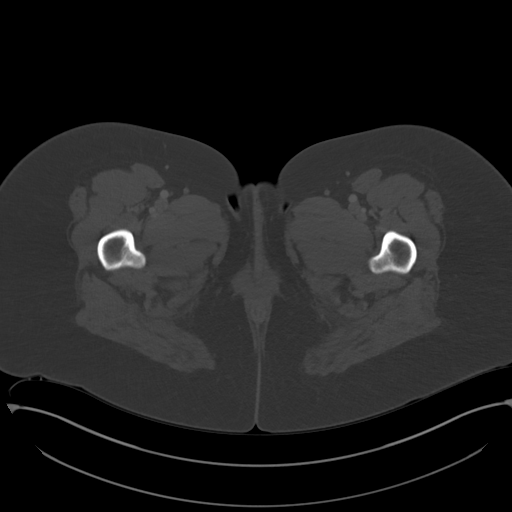
[im 15/109  soft-tissue]
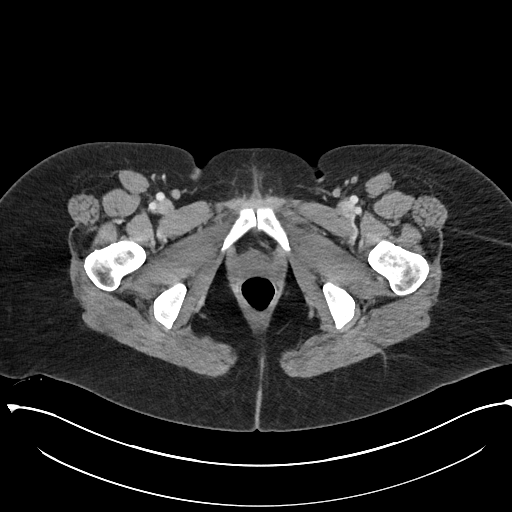
[im 22/109  soft-tissue]
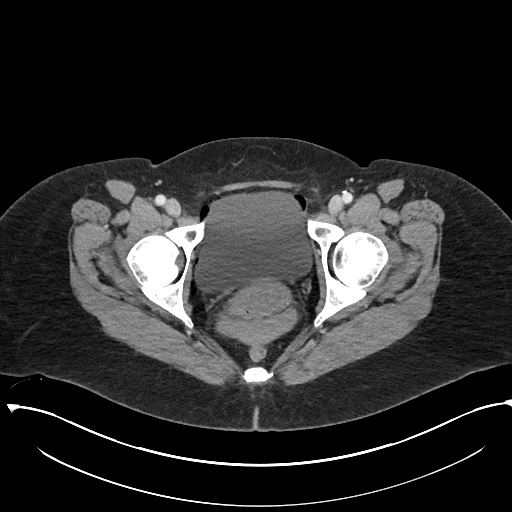
[im 29/109  soft-tissue]
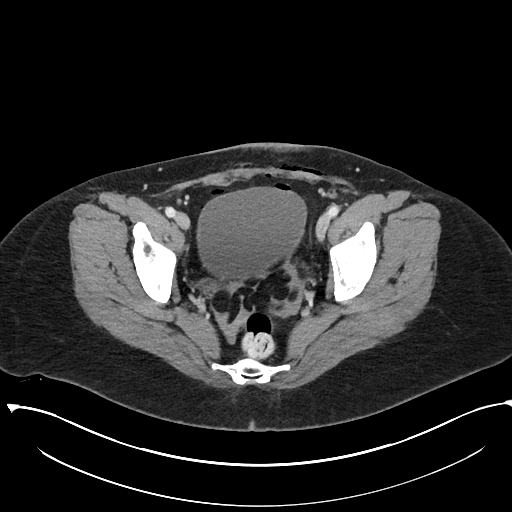
[im 37/109  soft-tissue]
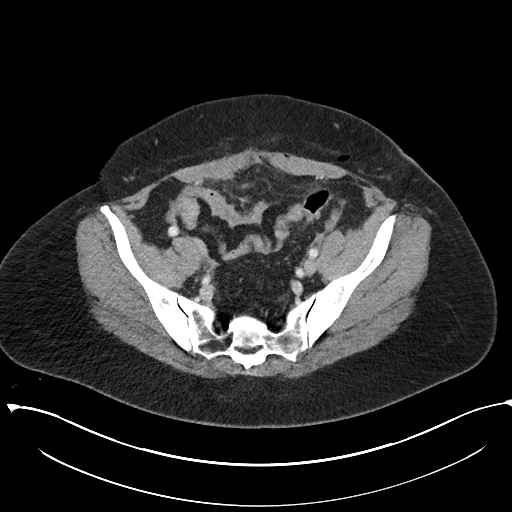
[im 44/109  soft-tissue]
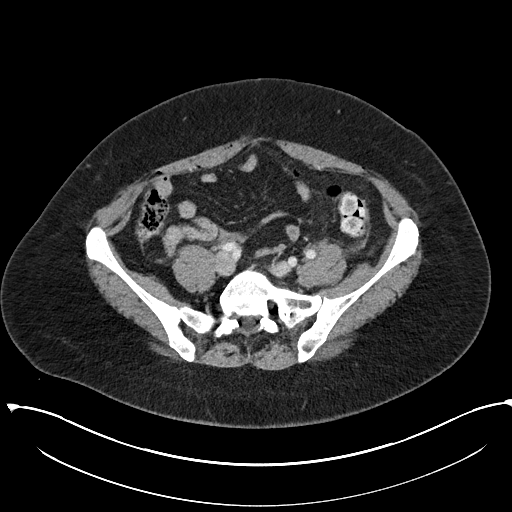
[im 58/109  soft-tissue]
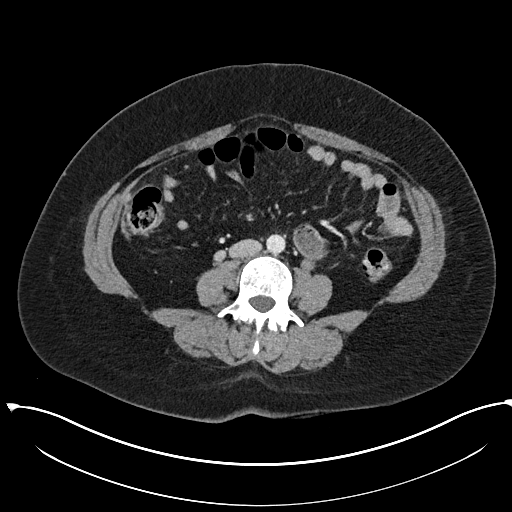
[im 65/109  soft-tissue]
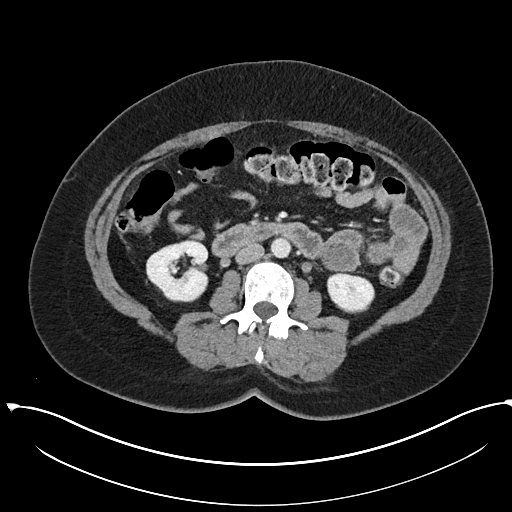
[im 73/109  soft-tissue]
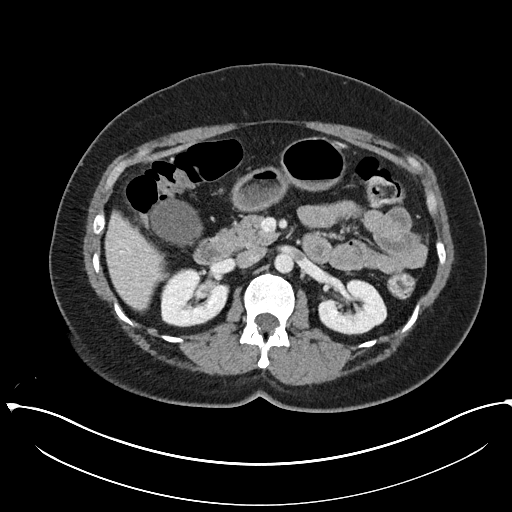
[im 73/109  bone]
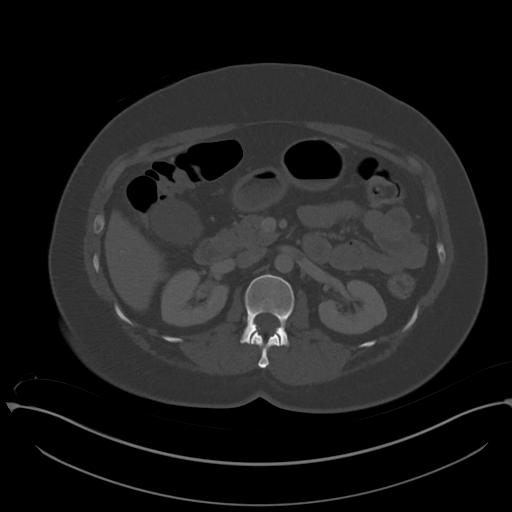
[im 80/109  soft-tissue]
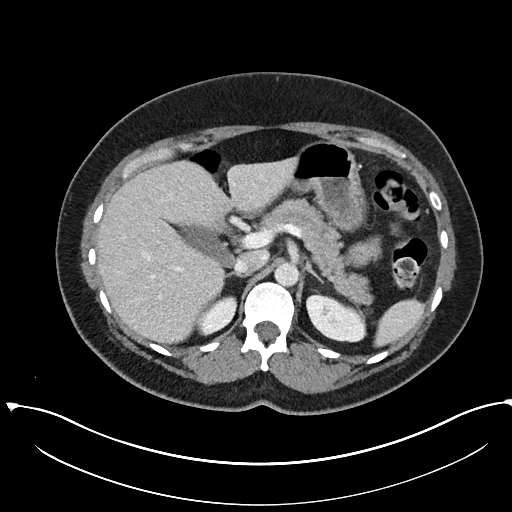
[im 87/109  soft-tissue]
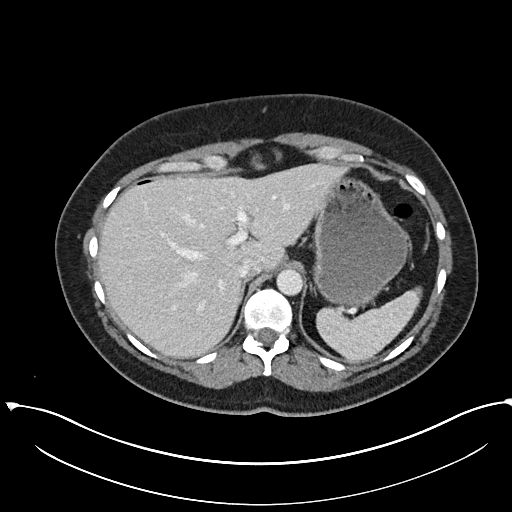
[im 94/109  soft-tissue]
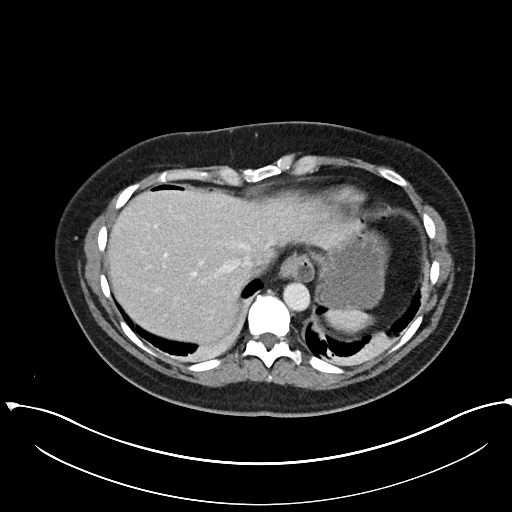
[im 101/109  soft-tissue]
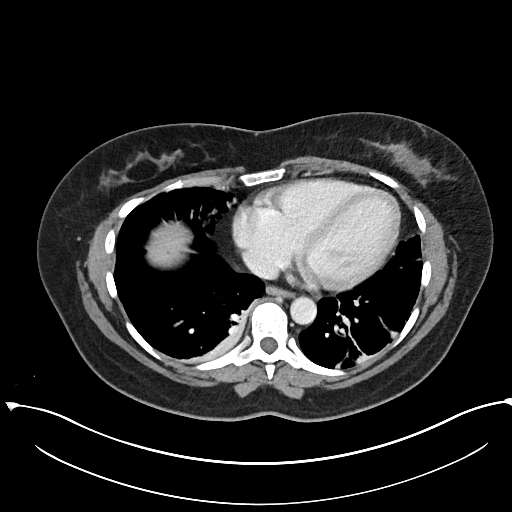

[Series 4: coronals abd pelvis 2.00 cor · coronal · 0.85mm/px · 3 of 160 slices shown]
[im 54/160  soft-tissue]
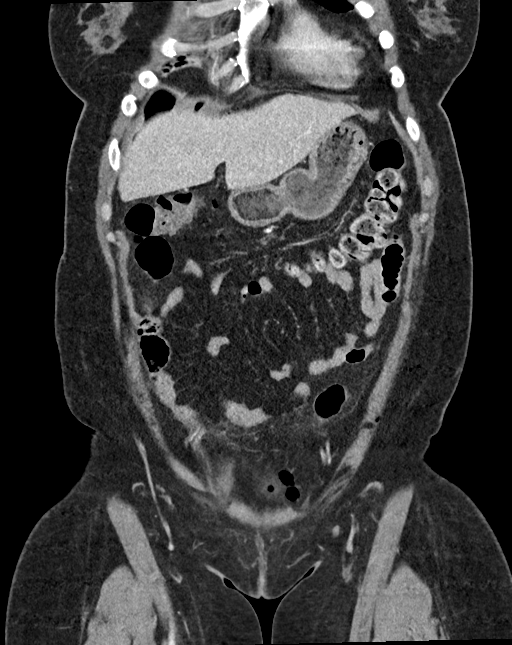
[im 71/160  soft-tissue]
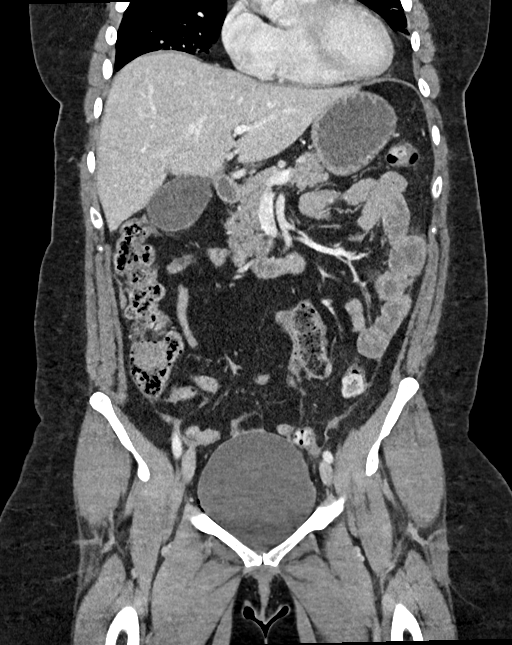
[im 89/160  soft-tissue]
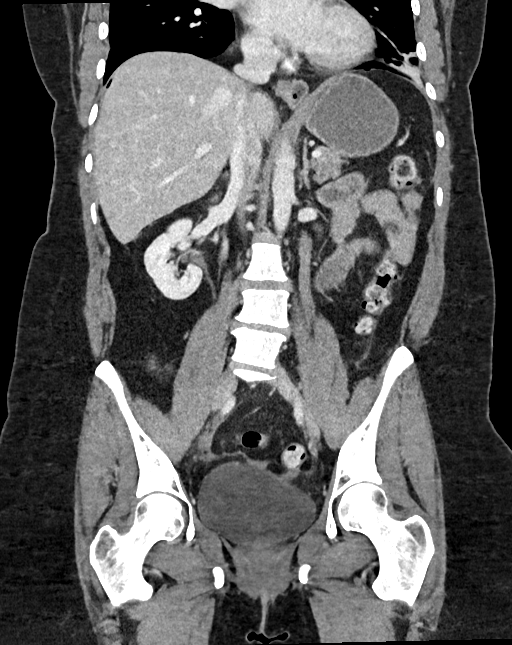

[16 of 46 positions shown; findings below may reference images not displayed]

FINDINGS: Lower chest: There is some basilar atelectasis, greater on the left.
No pleural or pericardial effusion.

Hepatobiliary: No focal liver abnormality is seen. No gallstones,
gallbladder wall thickening, or biliary dilatation.

Pancreas: Unremarkable. No pancreatic ductal dilatation or
surrounding inflammatory changes.

Spleen: Normal in size without focal abnormality.

Adrenals/Urinary Tract: Adrenal glands are unremarkable. Kidneys are
normal, without renal calculi, focal lesion, or hydronephrosis.
Small amount air within the urinary bladder is presumably due to
catheterization related to the patient's surgery.

Stomach/Bowel: Stomach is within normal limits. Status post
appendectomy. No evidence of bowel wall thickening, distention, or
inflammatory changes.

Vascular/Lymphatic: No significant vascular findings are present. No
enlarged abdominal or pelvic lymph nodes.

Reproductive: The patient is status post hysterectomy. There is a
small volume hemorrhagic fluid in the pelvis. Also seen is scattered
free intraperitoneal air and some air in the anterior abdominal
wall.

Other: As above.

Musculoskeletal: No acute or focal abnormality. There is some loss
of disc space height and endplate spurring at L5-S1.
IMPRESSION: Status post hysterectomy. Free intraperitoneal air, air in the
anterior abdominal wall and a locule air in the urinary bladder
presumably due to catheterization are expected findings after
surgery yesterday. No acute abnormality is identified.

## 2021-12-16 ENCOUNTER — Other Ambulatory Visit: Payer: Self-pay | Admitting: Family Medicine

## 2021-12-16 DIAGNOSIS — Z1231 Encounter for screening mammogram for malignant neoplasm of breast: Secondary | ICD-10-CM

## 2021-12-31 ENCOUNTER — Other Ambulatory Visit: Payer: Self-pay | Admitting: Orthopedic Surgery

## 2021-12-31 DIAGNOSIS — M5136 Other intervertebral disc degeneration, lumbar region: Secondary | ICD-10-CM

## 2021-12-31 DIAGNOSIS — G8929 Other chronic pain: Secondary | ICD-10-CM

## 2021-12-31 DIAGNOSIS — M5416 Radiculopathy, lumbar region: Secondary | ICD-10-CM

## 2022-01-12 ENCOUNTER — Ambulatory Visit
Admission: RE | Admit: 2022-01-12 | Discharge: 2022-01-12 | Disposition: A | Discharge status: Home / Self Care | Payer: BC Managed Care – PPO | Source: Ambulatory Visit | Attending: Orthopedic Surgery | Admitting: Orthopedic Surgery

## 2022-01-12 DIAGNOSIS — M5416 Radiculopathy, lumbar region: Secondary | ICD-10-CM

## 2022-01-12 DIAGNOSIS — M5136 Other intervertebral disc degeneration, lumbar region: Secondary | ICD-10-CM

## 2022-01-12 DIAGNOSIS — G8929 Other chronic pain: Secondary | ICD-10-CM

## 2022-01-26 ENCOUNTER — Ambulatory Visit
Admission: RE | Admit: 2022-01-26 | Discharge: 2022-01-26 | Disposition: A | Discharge status: Home / Self Care | Payer: BC Managed Care – PPO | Source: Ambulatory Visit | Attending: Family Medicine | Admitting: Family Medicine

## 2022-01-26 DIAGNOSIS — Z1231 Encounter for screening mammogram for malignant neoplasm of breast: Secondary | ICD-10-CM | POA: Diagnosis present

## 2022-02-01 ENCOUNTER — Other Ambulatory Visit: Payer: Self-pay | Admitting: Family Medicine

## 2022-02-01 DIAGNOSIS — R928 Other abnormal and inconclusive findings on diagnostic imaging of breast: Secondary | ICD-10-CM

## 2022-02-01 DIAGNOSIS — N6489 Other specified disorders of breast: Secondary | ICD-10-CM

## 2022-02-14 ENCOUNTER — Ambulatory Visit
Admission: RE | Admit: 2022-02-14 | Discharge: 2022-02-14 | Disposition: A | Discharge status: Home / Self Care | Payer: BC Managed Care – PPO | Source: Ambulatory Visit | Attending: Family Medicine | Admitting: Family Medicine

## 2022-02-14 DIAGNOSIS — R928 Other abnormal and inconclusive findings on diagnostic imaging of breast: Secondary | ICD-10-CM

## 2022-02-14 DIAGNOSIS — N6489 Other specified disorders of breast: Secondary | ICD-10-CM

## 2022-11-24 ENCOUNTER — Ambulatory Visit
Admission: EM | Admit: 2022-11-24 | Discharge: 2022-11-24 | Disposition: A | Discharge status: Home / Self Care | Payer: BC Managed Care – PPO | Attending: Emergency Medicine | Admitting: Emergency Medicine

## 2022-11-24 ENCOUNTER — Ambulatory Visit: Admit: 2022-11-24 | Payer: Self-pay

## 2022-11-24 ENCOUNTER — Ambulatory Visit (INDEPENDENT_AMBULATORY_CARE_PROVIDER_SITE_OTHER): Payer: BC Managed Care – PPO

## 2022-11-24 DIAGNOSIS — J069 Acute upper respiratory infection, unspecified: Secondary | ICD-10-CM | POA: Diagnosis not present

## 2022-11-24 MED ORDER — AEROCHAMBER MV MISC: 2 refills | Status: DC

## 2022-11-24 MED ORDER — IPRATROPIUM BROMIDE 0.06 % NA SOLN: 2.0000 | Freq: Four times a day (QID) | NASAL | 12 refills | Status: DC

## 2022-11-24 MED ORDER — ALBUTEROL SULFATE HFA 108 (90 BASE) MCG/ACT IN AERS: 2.0000 | INHALATION_SPRAY | RESPIRATORY_TRACT | 0 refills | Status: AC | PRN

## 2022-11-24 MED ORDER — PROMETHAZINE-DM 6.25-15 MG/5ML PO SYRP: 5.0000 mL | ORAL_SOLUTION | Freq: Four times a day (QID) | ORAL | 0 refills | Status: DC | PRN

## 2022-11-24 MED ORDER — BENZONATATE 100 MG PO CAPS: 200.0000 mg | ORAL_CAPSULE | Freq: Three times a day (TID) | ORAL | 0 refills | Status: DC

## 2022-11-24 NOTE — Discharge Instructions (Signed)
Your chest x-ray did not demonstrate any evidence of pneumonia.  I believe you have a viral respiratory infection.  Please use the following medication to help with your symptoms.  Use over-the-counter Tylenol and/or ibuprofen according to the package instructions as needed for fever or bodyaches.  Use the albuterol inhaler with a spacer, 2 puffs every 4-6 hours, as needed for shortness of breath or wheezing.  Use the Atrovent nasal spray, 2 squirts in each nostril every 6 hours, as needed for runny nose and postnasal drip.  Use the Tessalon Perles every 8 hours during the day.  Take them with a small sip of water.  They may give you some numbness to the base of your tongue or a metallic taste in your mouth, this is normal.  Use the Promethazine DM cough syrup at bedtime for cough and congestion.  It will make you drowsy so do not take it during the day.  Return for reevaluation or see your primary care provider for any new or worsening symptoms.

## 2022-11-24 NOTE — ED Provider Notes (Signed)
[facility]    CSN: [id number] Arrival date & time: 11/24/22  1608      History   Chief Complaint Chief Complaint  Patient presents with   Cough   Nasal Congestion   Headache   Generalized Body Aches   Chills         HPI Erica Fuller is a 52 y.o. female.   HPI  52 year old female with a past medical history significant for anxiety, depression, asthma, GERD, and pneumonia presents for evaluation of 4 days of headache, body aches, nasal congestion, chills, cough, shortness breath, and wheezing.  She denies any fevers.  She has been using over-the-counter Sudafed and Benadryl without relief.  She is concerned about possible pneumonia.  Past Medical History:  Diagnosis Date   Anemia    2 units of blood after miscarriage   Anxiety    Asthma    past hx   Depression    Encounter for blood transfusion    Fibroid    GERD (gastroesophageal reflux disease)    Menorrhagia    Pneumonia    2019   PONV (postoperative nausea and vomiting)    lots of itching post- op    There are no problems to display for this patient.   Past Surgical History:  Procedure Laterality Date   ABDOMINAL HYSTERECTOMY     APPENDECTOMY     CESAREAN SECTION     DIAGNOSTIC LAPAROSCOPY      Pelvic laparoscopy    DILATION AND CURETTAGE OF UTERUS     ENDOMETRIAL ABLATION     GYNECOLOGIC CRYOSURGERY     LAPAROSCOPIC BILATERAL SALPINGO OOPHERECTOMY Bilateral 07/27/2020   Procedure: LAPAROSCOPIC BILATERAL SALPINGO OOPHORECTOMY;  Surgeon: Ward, Honor Loh, MD;  Location: [facility];  Service: Gynecology;  Laterality: Bilateral;   LAPAROSCOPIC GELPORT ASSISTED MYOMECTOMY     LAPAROSCOPIC SUPRACERVICAL HYSTERECTOMY N/A 07/27/2020   Procedure: LAPAROSCOPIC SUPRACERVICAL HYSTERECTOMY;  Surgeon: Ward, Honor Loh, MD;  Location: [facility];  Service: Gynecology;  Laterality: N/A;   PLANTAR FASCIA RELEASE Left 07/21/2021   Procedure: PLANTAR FASCIA RELEASE;  Surgeon: Samara Deist, DPM;  Location:  [place];  Service: Podiatry;  Laterality: Left;  Latex   TARSAL TUNNEL RELEASE Left 07/21/2021   Procedure: TARSAL TUNNEL RELEASE;  Surgeon: Samara Deist, DPM;  Location: [place];  Service: Podiatry;  Laterality: Left;   TONSILLECTOMY     TUBAL LIGATION     VULVECTOMY     WISDOM TOOTH EXTRACTION      OB History   No obstetric history on file.      Home Medications    Prior to Admission medications   Medication Sig Start Date End Date Taking? Authorizing Provider  albuterol (VENTOLIN HFA) 108 (90 Base) MCG/ACT inhaler Inhale 2 puffs into the lungs every 4 (four) hours as needed. 11/24/22  Yes Margarette Canada, NP  benzonatate (TESSALON) 100 MG capsule Take 2 capsules (200 mg total) by mouth every 8 (eight) hours. 11/24/22  Yes Margarette Canada, NP  cyclobenzaprine (FLEXERIL) 10 MG tablet Take 10 mg by mouth daily as needed for muscle spasms. 07/03/20  Yes [provider]  escitalopram (LEXAPRO) 20 MG tablet Take 20 mg by mouth daily. 07/03/20  Yes [provider]  estradiol (ESTRACE) 1 MG tablet Take 1 mg by mouth daily.   Yes [provider]  gabapentin (NEURONTIN) 100 MG capsule Take 200-400 mg by mouth at bedtime. 04/01/20  Yes [provider]  ipratropium (ATROVENT) 0.06 % nasal  spray Place 2 sprays into both nostrils 4 (four) times daily. 11/24/22  Yes Margarette Canada, NP  omeprazole (PRILOSEC) 20 MG capsule Take 40 mg by mouth daily.   Yes [provider]  oxyCODONE-acetaminophen (PERCOCET) 5-325 MG tablet Take 1-2 tablets by mouth every 6 (six) hours as needed for severe pain. Max 6 tabs per day 07/21/21  Yes Samara Deist, DPM  promethazine-dextromethorphan (PROMETHAZINE-DM) 6.25-15 MG/5ML syrup Take 5 mLs by mouth 4 (four) times daily as needed. 11/24/22  Yes Margarette Canada, NP  Spacer/Aero-Holding Chambers (AEROCHAMBER MV) inhaler Use as instructed 11/24/22  Yes Margarette Canada, NP  estradiol (CLIMARA - DOSED IN MG/24 HR) 0.025  mg/24hr patch Place 1 patch (0.025 mg total) onto the skin once a week. Patient not taking: Reported on 07/08/2021 07/27/20 07/27/21  Ward, Honor Loh, MD    Family History Family History  Problem Relation Age of Onset   Breast cancer Maternal Grandmother 32   Breast cancer Cousin        mat cousin    Social History Social History   Tobacco Use   Smoking status: Never   Smokeless tobacco: Never  Vaping Use   Vaping Use: Never used  Substance Use Topics   Alcohol use: Not Currently    Comment: socially   Drug use: Not Currently     Allergies   Latex, Fentanyl, Codeine, and Sulfa antibiotics   Review of Systems Review of Systems  Constitutional:  Positive for chills. Negative for fever.  HENT:  Positive for congestion. Negative for rhinorrhea and sore throat.   Respiratory:  Positive for cough, shortness of breath and wheezing.   Musculoskeletal:  Positive for arthralgias and myalgias.  Neurological:  Positive for headaches.     Physical Exam Triage Vital Signs ED Triage Vitals  Enc Vitals Group     BP 11/24/22 1616 113/79     Pulse Rate 11/24/22 1616 76     Resp 11/24/22 1616 16     Temp 11/24/22 1616 98.9 F (37.2 C)     Temp Source 11/24/22 1616 Oral     SpO2 11/24/22 1616 98 %     Weight 11/24/22 1615 165 lb (74.8 kg)     Height 11/24/22 1615 5\' 10"  (1.778 m)     Head Circumference --      Peak Flow --      Pain Score 11/24/22 1615 5     Pain Loc --      Pain Edu? --      Excl. in [place]? --    No data found.  Updated Vital Signs BP 113/79 (BP Location: Left Arm)   Pulse 76   Temp 98.9 F (37.2 C) (Oral)   Resp 16   Ht 5\' 10"  (1.778 m)   Wt 165 lb (74.8 kg)   LMP 04/16/2020   SpO2 98%   BMI 23.68 kg/m   Visual Acuity Right Eye Distance:   Left Eye Distance:   Bilateral Distance:    Right Eye Near:   Left Eye Near:    Bilateral Near:     Physical Exam Vitals and nursing note reviewed.  Constitutional:      Appearance: Normal  appearance. She is not ill-appearing.  HENT:     Head: Normocephalic and atraumatic.     Right Ear: Tympanic membrane, ear canal and external ear normal. There is no impacted cerumen.     Left Ear: Tympanic membrane, ear canal and external ear normal. There is no impacted  cerumen.     Nose: Congestion and rhinorrhea present.     Comments: Nasal mucosa is erythematous and edematous with scant clear discharge in both nares.    Mouth/Throat:     Mouth: Mucous membranes are moist.     Pharynx: Oropharynx is clear. No oropharyngeal exudate or posterior oropharyngeal erythema.  Cardiovascular:     Rate and Rhythm: Normal rate and regular rhythm.     Pulses: Normal pulses.     Heart sounds: Normal heart sounds. No murmur heard.    No friction rub. No gallop.  Pulmonary:     Effort: Pulmonary effort is normal.     Breath sounds: Wheezing present. No rhonchi or rales.  Musculoskeletal:     Cervical back: Normal range of motion and neck supple.  Lymphadenopathy:     Cervical: No cervical adenopathy.  Skin:    General: Skin is warm and dry.     Capillary Refill: Capillary refill takes less than 2 seconds.  Neurological:     General: No focal deficit present.     Mental Status: She is alert and oriented to person, place, and time.      UC Treatments / Results  Labs (all labs ordered are listed, but only abnormal results are displayed) Labs Reviewed - No data to display  EKG   Radiology DG Chest 2 View  Result Date: 11/24/2022 CLINICAL DATA:  Shortness of breath, cough, wheezing EXAM: CHEST - 2 VIEW COMPARISON:  07/28/2020 FINDINGS: The heart size and mediastinal contours are within normal limits. Both lungs are clear. The visualized skeletal structures are unremarkable. IMPRESSION: No active cardiopulmonary disease. Electronically Signed   By: Elmer Picker M.D.   On: 11/24/2022 16:53    Procedures Procedures (including critical care time)  Medications Ordered in  UC Medications - No data to display  Initial Impression / Assessment and Plan / UC Course  I have reviewed the triage vital signs and the nursing notes.  Pertinent labs & imaging results that were available during my care of the patient were reviewed by me and considered in my medical decision making (see chart for details).   Patient is a pleasant, nontoxic-appearing 52 year old female presenting for evaluation of respiratory complaints x 4 days as outlined in HPI above.  She has a history of pneumonia and she is concerned that is what she has now.  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  She is experiencing cough is positive for yellow sputum production along with shortness of breath and wheezing.  Her lung sounds do reflect expiratory wheezes in bilateral bases.  Otherwise she is not in any acute respiratory distress and can speak in full sentences without dyspnea or tachypnea.  Her respiratory rate is 16 and her room air oxygen saturation is 98%.  She is afebrile at 98 9.  She does have inflammation of her upper respiratory tract on exam.  I will order a chest x-ray to look for any acute cardiopulmonary pathology.  Radiology impression of chest x-ray states there is no active cardiopulmonary disease.  I will discharge patient on the diagnosis of viral URI with a cough.  I will prescribe Atrovent nasal spray to help with nasal congestion along with Tessalon Perles and Promethazine DM cough syrup to help with cough and congestion.  Albuterol inhaler and spacer to help with wheezing.  Work note provided.  Return precautions reviewed.   Final Clinical Impressions(s) / UC Diagnoses   Final diagnoses:  Viral URI with cough     Discharge Instructions      Your chest x-ray did not demonstrate any evidence of pneumonia.  I believe you have a viral respiratory infection.  Please use the following medication to help with your symptoms.  Use over-the-counter Tylenol and/or  ibuprofen according to the package instructions as needed for fever or bodyaches.  Use the albuterol inhaler with a spacer, 2 puffs every 4-6 hours, as needed for shortness of breath or wheezing.  Use the Atrovent nasal spray, 2 squirts in each nostril every 6 hours, as needed for runny nose and postnasal drip.  Use the Tessalon Perles every 8 hours during the day.  Take them with a small sip of water.  They may give you some numbness to the base of your tongue or a metallic taste in your mouth, this is normal.  Use the Promethazine DM cough syrup at bedtime for cough and congestion.  It will make you drowsy so do not take it during the day.  Return for reevaluation or see your primary care provider for any new or worsening symptoms.      ED Prescriptions     Medication Sig Dispense Auth. Provider   albuterol (VENTOLIN HFA) 108 (90 Base) MCG/ACT inhaler Inhale 2 puffs into the lungs every 4 (four) hours as needed. 18 g Margarette Canada, NP   Spacer/Aero-Holding Chambers (AEROCHAMBER MV) inhaler Use as instructed 1 each Margarette Canada, NP   benzonatate (TESSALON) 100 MG capsule Take 2 capsules (200 mg total) by mouth every 8 (eight) hours. 21 capsule Margarette Canada, NP   ipratropium (ATROVENT) 0.06 % nasal spray Place 2 sprays into both nostrils 4 (four) times daily. 15 mL Margarette Canada, NP   promethazine-dextromethorphan (PROMETHAZINE-DM) 6.25-15 MG/5ML syrup Take 5 mLs by mouth 4 (four) times daily as needed. 118 mL Margarette Canada, NP      PDMP not reviewed this encounter.   Margarette Canada, NP 11/24/22 1659

## 2022-11-24 NOTE — ED Triage Notes (Signed)
Pt c/o cough,congestion,HA,bodyaches & chills x4 days. Denies any fevers. Otc pseudophed & benadryl w/o relief. Also c/o wheezing since mon & sob since today concerned about poss pneumonia.

## 2023-04-04 ENCOUNTER — Other Ambulatory Visit: Payer: Self-pay | Admitting: Family Medicine

## 2023-04-04 DIAGNOSIS — Z1231 Encounter for screening mammogram for malignant neoplasm of breast: Secondary | ICD-10-CM

## 2023-05-02 ENCOUNTER — Ambulatory Visit: Payer: BC Managed Care – PPO

## 2023-05-18 ENCOUNTER — Ambulatory Visit
Admission: RE | Admit: 2023-05-18 | Discharge: 2023-05-18 | Disposition: A | Discharge status: Home / Self Care | Payer: BC Managed Care – PPO | Source: Ambulatory Visit | Attending: Family Medicine | Admitting: Family Medicine

## 2023-05-18 DIAGNOSIS — Z1231 Encounter for screening mammogram for malignant neoplasm of breast: Secondary | ICD-10-CM | POA: Insufficient documentation

## 2023-12-07 ENCOUNTER — Ambulatory Visit
Admission: EM | Admit: 2023-12-07 | Discharge: 2023-12-07 | Disposition: A | Discharge status: Home / Self Care | Attending: Family Medicine | Admitting: Family Medicine

## 2023-12-07 ENCOUNTER — Encounter: Payer: Self-pay | Admitting: Emergency Medicine

## 2023-12-07 DIAGNOSIS — N3001 Acute cystitis with hematuria: Secondary | ICD-10-CM | POA: Insufficient documentation

## 2023-12-07 LAB — URINALYSIS, W/ REFLEX TO CULTURE (INFECTION SUSPECTED)
Bilirubin Urine: NEGATIVE
Glucose, UA: NEGATIVE mg/dL
Ketones, ur: NEGATIVE mg/dL
Nitrite: POSITIVE — AB
Protein, ur: 100 mg/dL — AB
Specific Gravity, Urine: 1.015 (ref 1.005–1.030)
WBC, UA: >50 WBC/hpf (ref 0–5)
pH: 6.5 (ref 5.0–8.0)

## 2023-12-07 MED ORDER — PHENAZOPYRIDINE HCL 200 MG PO TABS: 200.0000 mg | ORAL_TABLET | Freq: Three times a day (TID) | ORAL | 0 refills | Status: DC

## 2023-12-07 MED ORDER — NITROFURANTOIN MONOHYD MACRO 100 MG PO CAPS: 100.0000 mg | ORAL_CAPSULE | Freq: Two times a day (BID) | ORAL | 0 refills | Status: DC

## 2023-12-07 NOTE — ED Provider Notes (Signed)
 [facility]    CSN: [id number] Arrival date & time: 12/07/23  1240      History   Chief Complaint Chief Complaint  Patient presents with   Back Pain   Dysuria     HPI HPI Erica Fuller is a 53 y.o. female.    Erica Fuller presents for lower back pain with dysuria that started 2 days ago. Has been having bladder spasms. Blood on the toilet paper started this morning. No history of kidney stones. Tried ibuprofen and heating pad prior to arrival.  Has not had any antibiotics in last 30 days.   Denies known STI exposure.  Patient's last menstrual period was 04/16/2020.    - Abnormal vaginal discharge: no - vaginal odor: no - vaginal bleeding: no - Dysuria: yes - Hematuria:yes  - Urinary urgency: yes  - Urinary frequency: yes  - Fever: no - Abdominal pain: no   - Pelvic pain: yes - Rash/Skin lesions/mouth ulcers: no - Nausea: no  - Vomiting: no  - Back Pain: yes       Past Medical History:  Diagnosis Date   Anemia    2 units of blood after miscarriage   Anxiety    Asthma    past hx   Depression    Encounter for blood transfusion    Fibroid    GERD (gastroesophageal reflux disease)    Menorrhagia    Pneumonia    2019   PONV (postoperative nausea and vomiting)    lots of itching post- op    There are no active problems to display for this patient.   Past Surgical History:  Procedure Laterality Date   ABDOMINAL HYSTERECTOMY     APPENDECTOMY     CESAREAN SECTION     DIAGNOSTIC LAPAROSCOPY      Pelvic laparoscopy    DILATION AND CURETTAGE OF UTERUS     ENDOMETRIAL ABLATION     GYNECOLOGIC CRYOSURGERY     LAPAROSCOPIC BILATERAL SALPINGO OOPHERECTOMY Bilateral 07/27/2020   Procedure: LAPAROSCOPIC BILATERAL SALPINGO OOPHORECTOMY;  Surgeon: Ward, Margarie Shay, MD;  Location: [facility];  Service: Gynecology;  Laterality: Bilateral;   LAPAROSCOPIC GELPORT ASSISTED MYOMECTOMY     LAPAROSCOPIC SUPRACERVICAL HYSTERECTOMY N/A 07/27/2020    Procedure: LAPAROSCOPIC SUPRACERVICAL HYSTERECTOMY;  Surgeon: Ward, Margarie Shay, MD;  Location: [facility];  Service: Gynecology;  Laterality: N/A;   PLANTAR FASCIA RELEASE Left 07/21/2021   Procedure: PLANTAR FASCIA RELEASE;  Surgeon: Anell Baptist, DPM;  Location: [facility];  Service: Podiatry;  Laterality: Left;  Latex   TARSAL TUNNEL RELEASE Left 07/21/2021   Procedure: TARSAL TUNNEL RELEASE;  Surgeon: Anell Baptist, DPM;  Location: [facility];  Service: Podiatry;  Laterality: Left;   TONSILLECTOMY     TUBAL LIGATION     VULVECTOMY     WISDOM TOOTH EXTRACTION      OB History   No obstetric history on file.      Home Medications    Prior to Admission medications   Medication Sig Start Date End Date Taking? Authorizing Provider  buPROPion (WELLBUTRIN XL) 300 MG 24 hr tablet Take by mouth. 08/15/23 08/14/24 Yes [provider]  nitrofurantoin, macrocrystal-monohydrate, (MACROBID) 100 MG capsule Take 1 capsule (100 mg total) by mouth 2 (two) times daily. 12/07/23  Yes Makaveli Hoard, DO  phenazopyridine (PYRIDIUM) 200 MG tablet Take 1 tablet (200 mg total) by mouth 3 (three) times daily. 12/07/23  Yes Irisha Grandmaison, DO  Semaglutide-Weight Management (WEGOVY) 2.4 MG/0.75ML SOAJ  INJECT 0.75 MLS (2.4 MG TOTAL) SUBCUTANEOUSLY ONCE A WEEK 05/25/23  Yes [provider]  traZODone (DESYREL) 50 MG tablet Take 1 tablet by mouth at bedtime. 11/17/23 11/16/24 Yes [provider]  albuterol (VENTOLIN HFA) 108 (90 Base) MCG/ACT inhaler Inhale 2 puffs into the lungs every 4 (four) hours as needed. 11/24/22   Kent Pear, NP  benzonatate (TESSALON) 100 MG capsule Take 2 capsules (200 mg total) by mouth every 8 (eight) hours. 11/24/22   Kent Pear, NP  cyclobenzaprine (FLEXERIL) 10 MG tablet Take 10 mg by mouth daily as needed for muscle spasms. 07/03/20   [provider]  escitalopram (LEXAPRO) 20 MG tablet Take 20 mg by mouth daily. 07/03/20    [provider]  estradiol (CLIMARA - DOSED IN MG/24 HR) 0.025 mg/24hr patch Place 1 patch (0.025 mg total) onto the skin once a week. Patient not taking: Reported on 07/08/2021 07/27/20 07/27/21  Ward, Margarie Shay, MD  estradiol (ESTRACE) 1 MG tablet Take 1 mg by mouth daily.    [provider]  gabapentin (NEURONTIN) 100 MG capsule Take 200-400 mg by mouth at bedtime. 04/01/20   [provider]  ipratropium (ATROVENT) 0.06 % nasal spray Place 2 sprays into both nostrils 4 (four) times daily. 11/24/22   Kent Pear, NP  omeprazole (PRILOSEC) 20 MG capsule Take 40 mg by mouth daily.    [provider]  oxyCODONE-acetaminophen (PERCOCET) 5-325 MG tablet Take 1-2 tablets by mouth every 6 (six) hours as needed for severe pain. Max 6 tabs per day 07/21/21   Anell Baptist, DPM  Spacer/Aero-Holding Chambers (AEROCHAMBER MV) inhaler Use as instructed 11/24/22   Kent Pear, NP  tiZANidine (ZANAFLEX) 2 MG tablet Take 2 mg by mouth 2 (two) times daily.    [provider]    Family History Family History  Problem Relation Age of Onset   Breast cancer Maternal Grandmother 48   Breast cancer Cousin        mat cousin    Social History Social History   Tobacco Use   Smoking status: Never   Smokeless tobacco: Never  Vaping Use   Vaping status: Never Used  Substance Use Topics   Alcohol use: Not Currently    Comment: socially   Drug use: Not Currently     Allergies   Latex, Fentanyl, Codeine, and Sulfa antibiotics   Review of Systems Review of Systems: :negative unless otherwise stated in HPI.      Physical Exam Triage Vital Signs ED Triage Vitals  Encounter Vitals Group     BP 12/07/23 1259 110/65     Systolic BP Percentile --      Diastolic BP Percentile --      Pulse Rate 12/07/23 1259 69     Resp 12/07/23 1259 16     Temp 12/07/23 1259 98 F (36.7 C)     Temp src --      SpO2 12/07/23 1259 99 %     Weight --      Height --       Head Circumference --      Peak Flow --      Pain Score 12/07/23 1258 6     Pain Loc --      Pain Education --      Exclude from Growth Chart --    No data found.  Updated Vital Signs BP 110/65 (BP Location: Left Arm)   Pulse 69   Temp 98 F (36.7  C)   Resp 16   LMP 04/16/2020   SpO2 99%   Visual Acuity Right Eye Distance:   Left Eye Distance:   Bilateral Distance:    Right Eye Near:   Left Eye Near:    Bilateral Near:     Physical Exam GEN: well appearing female in no acute distress  CVS: well perfused  RESP: speaking in full sentences without pause      UC Treatments / Results  Labs (all labs ordered are listed, but only abnormal results are displayed) Labs Reviewed  URINALYSIS, W/ REFLEX TO CULTURE (INFECTION SUSPECTED) - Abnormal; Notable for the following components:      Result Value   Color, Urine STRAW (*)    APPearance HAZY (*)    Hgb urine dipstick MODERATE (*)    Protein, ur 100 (*)    Nitrite POSITIVE (*)    Leukocytes,Ua MODERATE (*)    Bacteria, UA MANY (*)    All other components within normal limits  URINE CULTURE    EKG   Radiology No results found.  Procedures Procedures (including critical care time)  Medications Ordered in UC Medications - No data to display  Initial Impression / Assessment and Plan / UC Course  I have reviewed the triage vital signs and the nursing notes.  Pertinent labs & imaging results that were available during my care of the patient were reviewed by me and considered in my medical decision making (see chart for details).        Acute cystitis:  Patient is a 52 y.o. female  who presents for 2 days of dysuria.  Overall patient is well-appearing and afebrile.  Vital signs stable.  Urinalysis consistent with acute cystitis with hematuria supported on microscopy. Treat with Macrobid 2 times daily for 5 days and Pyridium for discomfort.  Urine culture obtained.  Follow-up sensitivities and change  antibiotics, if needed.   - Return precautions including abdominal pain, fever, chills, nausea, or vomiting given. Follow-up,  if symptoms not improving or getting worse. Discussed MDM, treatment plan and plan for follow-up with patient who agrees with plan.        Final Clinical Impressions(s) / UC Diagnoses   Final diagnoses:  Acute cystitis with hematuria     Discharge Instructions      I sent your urine for culture to be sure the antibiotics prescribed will cover your urinary tract infection.   Stop by the pharmacy to pick up your prescriptions.  Follow up with your primary care provider or return to the urgent care, if not improving.       ED Prescriptions     Medication Sig Dispense Auth. Provider   nitrofurantoin, macrocrystal-monohydrate, (MACROBID) 100 MG capsule Take 1 capsule (100 mg total) by mouth 2 (two) times daily. 10 capsule Everlie Eble, DO   phenazopyridine (PYRIDIUM) 200 MG tablet Take 1 tablet (200 mg total) by mouth 3 (three) times daily. 6 tablet Fidel Huddle, DO      PDMP not reviewed this encounter.   Mischelle Reeg, DO 12/14/23 0448

## 2023-12-07 NOTE — Discharge Instructions (Addendum)
 I sent your urine for culture to be sure the antibiotics prescribed will cover your urinary tract infection.   Stop by the pharmacy to pick up your prescriptions.  Follow up with your primary care provider or return to the urgent care, if not improving.

## 2023-12-07 NOTE — ED Triage Notes (Signed)
 Pt presents with lower back pain, lower abdominal pain, dysuria and hematuria x 2-3 days.

## 2023-12-09 LAB — URINE CULTURE: Culture: 60000 — AB

## 2024-06-05 ENCOUNTER — Other Ambulatory Visit: Payer: Self-pay | Admitting: Family Medicine

## 2024-06-05 DIAGNOSIS — Z1231 Encounter for screening mammogram for malignant neoplasm of breast: Secondary | ICD-10-CM

## 2024-07-10 ENCOUNTER — Ambulatory Visit
Admission: RE | Admit: 2024-07-10 | Discharge: 2024-07-10 | Disposition: A | Discharge status: Home / Self Care | Source: Ambulatory Visit | Attending: Family Medicine | Admitting: Family Medicine

## 2024-07-10 DIAGNOSIS — Z1231 Encounter for screening mammogram for malignant neoplasm of breast: Secondary | ICD-10-CM | POA: Insufficient documentation

## 2024-07-12 ENCOUNTER — Encounter: Payer: Self-pay | Admitting: Family Medicine

## 2024-07-12 ENCOUNTER — Other Ambulatory Visit: Payer: Self-pay | Admitting: Family Medicine

## 2024-07-12 DIAGNOSIS — R928 Other abnormal and inconclusive findings on diagnostic imaging of breast: Secondary | ICD-10-CM

## 2024-07-16 ENCOUNTER — Ambulatory Visit
Admission: RE | Admit: 2024-07-16 | Discharge: 2024-07-16 | Disposition: A | Discharge status: Home / Self Care | Source: Ambulatory Visit | Attending: Family Medicine | Admitting: Family Medicine

## 2024-07-16 DIAGNOSIS — R928 Other abnormal and inconclusive findings on diagnostic imaging of breast: Secondary | ICD-10-CM | POA: Diagnosis present

## 2024-07-21 ENCOUNTER — Encounter: Payer: Self-pay | Admitting: Emergency Medicine

## 2024-07-21 ENCOUNTER — Ambulatory Visit
Admission: EM | Admit: 2024-07-21 | Discharge: 2024-07-21 | Disposition: A | Discharge status: Home / Self Care | Attending: Emergency Medicine | Admitting: Emergency Medicine

## 2024-07-21 DIAGNOSIS — L231 Allergic contact dermatitis due to adhesives: Secondary | ICD-10-CM | POA: Diagnosis not present

## 2024-07-21 DIAGNOSIS — T7840XA Allergy, unspecified, initial encounter: Secondary | ICD-10-CM

## 2024-07-21 MED ORDER — CETIRIZINE HCL 10 MG PO TABS
10.0000 mg | ORAL_TABLET | Freq: Every day | ORAL | Status: DC
  Administered 2024-07-21: 10 mg via ORAL

## 2024-07-21 MED ORDER — BACITRACIN-POLYMYXIN B 500-10000 UNIT/GM OP OINT: TOPICAL_OINTMENT | Freq: Once | OPHTHALMIC | Status: DC

## 2024-07-21 MED ORDER — CEPHALEXIN 500 MG PO CAPS: 1000.0000 mg | ORAL_CAPSULE | Freq: Two times a day (BID) | ORAL | 0 refills | Status: AC

## 2024-07-21 MED ORDER — PREDNISONE 10 MG (21) PO TBPK
ORAL_TABLET | ORAL | 0 refills | Status: AC
Start: 2024-07-21 — End: ?

## 2024-07-21 MED ORDER — PREDNISONE 50 MG PO TABS
60.0000 mg | ORAL_TABLET | Freq: Once | ORAL | Status: AC
  Administered 2024-07-21: 60 mg via ORAL

## 2024-07-21 NOTE — ED Triage Notes (Signed)
 Patient states that she had false eyelashes applied to both eyes on Thursday.  Patient states that she started having itching, redness and swelling in her eyelids.  Patient has tried to get them off.  Patient has been applying cold compresses to her eyes.  Patient also has been taking Benadryl and last dose was at 6 am this morning.

## 2024-07-21 NOTE — Discharge Instructions (Signed)
 Finish the prednisone , even if you feel better.  Take Zyrtec  10 mg once or twice a day.  Cool compresses.  Finish the Keflex .  May continue the ophthalmic ointment that I gave you here.  Please follow-up with the place that put on the eyelashes to have them removed and if you get worse, follow-up with an eye doctor

## 2024-07-21 NOTE — ED Provider Notes (Signed)
 HPI  SUBJECTIVE:  Erica Fuller is a 52 y.o. female who presents with burning bilateral upper eyelid pain, swelling, erythema and itching after having false eyelashes applied to her upper lid lids 4 days ago for a wedding.  She states that the itching started later that night and is getting worse.  She describes the pain as sharp, burning, like razor blades.  She reports facial itching.  She reports some conjunctival injection, but no eye pain, purulent discharge, visual changes.  No angioedema, sensation of throat swelling shut, wheezing or shortness of breath, nausea vomiting or diarrhea, abdominal pain, syncope.  She has tried applying Neosporin, liquid coconut oil, a specific eyelash glue remover, Lumify and Opcon eyedrops, eye make-up remover, cold compresses, keeping her eyes closed and has been taking taking Benadryl.  The eyelash glue remover made her symptoms worse, keeping her eyes closed and the cool compresses help.  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  No history of MRSA.  PCP: [facility].    Past Medical History:  Diagnosis Date   Anemia    2 units of blood after miscarriage   Anxiety    Asthma    past hx   Depression    Encounter for blood transfusion    Fibroid    GERD (gastroesophageal reflux disease)    Menorrhagia    Pneumonia    2019   PONV (postoperative nausea and vomiting)    lots of itching post- op    Past Surgical History:  Procedure Laterality Date   ABDOMINAL HYSTERECTOMY     APPENDECTOMY     CESAREAN SECTION     DIAGNOSTIC LAPAROSCOPY      Pelvic laparoscopy    DILATION AND CURETTAGE OF UTERUS     ENDOMETRIAL ABLATION     GYNECOLOGIC CRYOSURGERY     LAPAROSCOPIC BILATERAL SALPINGO OOPHERECTOMY Bilateral 07/27/2020   Procedure: LAPAROSCOPIC BILATERAL SALPINGO OOPHORECTOMY;  Surgeon: Ward, Mitzie BROCKS, MD;  Location: [facility];  Service: Gynecology;  Laterality: Bilateral;    LAPAROSCOPIC GELPORT ASSISTED MYOMECTOMY     LAPAROSCOPIC SUPRACERVICAL HYSTERECTOMY N/A 07/27/2020   Procedure: LAPAROSCOPIC SUPRACERVICAL HYSTERECTOMY;  Surgeon: Ward, Mitzie BROCKS, MD;  Location: [facility];  Service: Gynecology;  Laterality: N/A;   PLANTAR FASCIA RELEASE Left 07/21/2021   Procedure: PLANTAR FASCIA RELEASE;  Surgeon: Prospero Mahnke Soulier, DPM;  Location: [facility];  Service: Podiatry;  Laterality: Left;  Latex   TARSAL TUNNEL RELEASE Left 07/21/2021   Procedure: TARSAL TUNNEL RELEASE;  Surgeon: Anael Rosch Soulier, DPM;  Location: [facility];  Service: Podiatry;  Laterality: Left;   TONSILLECTOMY     TUBAL LIGATION     VULVECTOMY     WISDOM TOOTH EXTRACTION      Family History  Problem Relation Age of Onset   Breast cancer Maternal Grandmother 1   Breast cancer Cousin        mat cousin    Social History   Tobacco Use   Smoking status: Never   Smokeless tobacco: Never  Vaping Use   Vaping status: Never Used  Substance Use Topics   Alcohol use: Not Currently    Comment: socially   Drug use: Not Currently     Current Facility-Administered Medications:    bacitracin -polymyxin b  (POLYSPORIN ) ophthalmic ointment, , Both Eyes, Once, Van Knee, MD   cetirizine  (ZYRTEC ) tablet 10 mg, 10 mg, Oral, Daily, Aliyha Fornes, MD, 10 mg at 07/21/24 1312  Current Outpatient  Medications:    cephALEXin  (KEFLEX ) 500 MG capsule, Take 2 capsules (1,000 mg total) by mouth 2 (two) times daily for 5 days., Disp: 20 capsule, Rfl: 0   predniSONE  (STERAPRED UNI-PAK 21 TAB) 10 MG (21) TBPK tablet, Dispense one 6 day pack. Take as directed with food., Disp: 21 tablet, Rfl: 0   albuterol  (VENTOLIN  HFA) 108 (90 Base) MCG/ACT inhaler, Inhale 2 puffs into the lungs every 4 (four) hours as needed., Disp: 18 g, Rfl: 0   buPROPion (WELLBUTRIN XL) 300 MG 24 hr tablet, Take by mouth., Disp: , Rfl:    cyclobenzaprine (FLEXERIL) 10 MG tablet, Take 10 mg by mouth daily as needed  for muscle spasms., Disp: , Rfl:    escitalopram (LEXAPRO) 20 MG tablet, Take 20 mg by mouth daily., Disp: , Rfl:    estradiol  (CLIMARA  - DOSED IN MG/24 HR) 0.025 mg/24hr patch, Place 1 patch (0.025 mg total) onto the skin once a week. (Patient not taking: Reported on 07/08/2021), Disp: 4 patch, Rfl: 11   estradiol  (ESTRACE ) 1 MG tablet, Take 1 mg by mouth daily., Disp: , Rfl:    gabapentin  (NEURONTIN ) 100 MG capsule, Take 200-400 mg by mouth at bedtime., Disp: , Rfl:    omeprazole (PRILOSEC) 20 MG capsule, Take 40 mg by mouth daily., Disp: , Rfl:    Semaglutide-Weight Management (WEGOVY) 2.4 MG/0.75ML SOAJ, INJECT 0.75 MLS (2.4 MG TOTAL) SUBCUTANEOUSLY ONCE A WEEK, Disp: , Rfl:    tiZANidine (ZANAFLEX) 2 MG tablet, Take 2 mg by mouth 2 (two) times daily., Disp: , Rfl:    traZODone (DESYREL) 50 MG tablet, Take 1 tablet by mouth at bedtime., Disp: , Rfl:   Allergies  Allergen Reactions   Latex Anaphylaxis    Aerosolized powder from gloves caused anaphylaxis   Fentanyl  Itching    Severe Itching   Codeine Rash   Sulfa Antibiotics Rash     ROS  As noted in HPI.   Physical Exam  BP 119/82 (BP Location: Right Arm)   Pulse 61   Temp 97.7 F (36.5 C) (Oral)   Resp 14   Ht 5' 10 (1.778 m)   Wt 74.8 kg   LMP 04/16/2020   SpO2 98%   BMI 23.66 kg/m   Constitutional: Well developed, well nourished, no acute distress Eyes:  EOMI, conjunctiva normal bilaterally no photophobia.  No purulent drainage. Bilateral upper eyelids in the area superior to the eye erythematous, swollen, tender, irritated. No infraorbital swelling, tenderness         HENT: Normocephalic, atraumatic,mucus membranes moist Respiratory: Normal inspiratory effort Cardiovascular: Normal rate GI: nondistended skin: No rash, skin intact Musculoskeletal: no deformities Neurologic: Alert & oriented x 3, no focal neuro deficits Psychiatric: Speech and behavior appropriate   ED Course   Medications   bacitracin -polymyxin b  (POLYSPORIN ) ophthalmic ointment (has no administration in time range)  cetirizine  (ZYRTEC ) tablet 10 mg (10 mg Oral Given 07/21/24 1312)  predniSONE  (DELTASONE ) tablet 60 mg (60 mg Oral Given 07/21/24 1311)    No orders of the defined types were placed in this encounter.   No results found for this or any previous visit (from the past 24 hours). No results found.  ED Clinical Impression  1. Allergic contact dermatitis due to adhesives   2. Allergic reaction, initial encounter      ED Assessment/Plan     After obtaining verbal consent, applied bacitracin  polymyxin ophthalmic ointment to the lashes.  Let that sit for approximately 10 minutes and then attempted  to remove lashes without much success.  Then tried solidified coconut oil.  Will leave this on for 10 minutes and attempt/removal.  Will give 60 mg of prednisone  and 10 mg of Zyrtec  here she is reporting itching over her entire face.  Attempted to remove the eyelashes again without success.  Presentation consistent with an allergic contact dermatitis, which has gotten worse through multiple attempts at removing them.  Concern for secondary infection.  She has no history of MRSA.  Home Keflex  1000 mg p.o. twice daily for 5 days, 6-day 10 mg prednisone  taper.  Will send her home with a bacitracin /polymyxin ointment, cool compresses, Tylenol  combined with ibuprofen  3-4 times a day, and have her follow-up with the aesthetician who put the lashes on to have them removed.  She is to follow-up with an ophthalmologist if symptoms get worse.  Discussed  MDM, treatment plan, and plan for follow-up with patient. patient agrees with plan.   Meds ordered this encounter  Medications   bacitracin -polymyxin b  (POLYSPORIN ) ophthalmic ointment   cetirizine  (ZYRTEC ) tablet 10 mg   predniSONE  (DELTASONE ) tablet 60 mg   cephALEXin  (KEFLEX ) 500 MG capsule    Sig: Take 2 capsules (1,000 mg total) by mouth 2 (two) times  daily for 5 days.    Dispense:  20 capsule    Refill:  0   predniSONE  (STERAPRED UNI-PAK 21 TAB) 10 MG (21) TBPK tablet    Sig: Dispense one 6 day pack. Take as directed with food.    Dispense:  21 tablet    Refill:  0      *This clinic note was created using Scientist, clinical (histocompatibility and immunogenetics). Therefore, there may be occasional mistakes despite careful proofreading.  ?    Van Knee, MD 07/22/24 [phone]
